# Patient Record
Sex: Male | Born: 1950
Health system: Southern US, Community
[De-identification: ages and names within clinical notes are randomized; demographics above are authoritative.]

## PROBLEM LIST (undated history)

## (undated) DIAGNOSIS — E7800 Pure hypercholesterolemia, unspecified: Secondary | ICD-10-CM

## (undated) DIAGNOSIS — I10 Essential (primary) hypertension: Secondary | ICD-10-CM

## (undated) HISTORY — PX: HERNIA REPAIR: SHX51

## (undated) HISTORY — PX: OTHER SURGICAL HISTORY: SHX169

---

## 2007-04-25 ENCOUNTER — Inpatient Hospital Stay (HOSPITAL_COMMUNITY): Admission: RE | Admit: 2007-04-25 | Discharge: 2007-04-30 | Payer: Self-pay | Admitting: Orthopedic Surgery

## 2008-06-18 ENCOUNTER — Inpatient Hospital Stay (HOSPITAL_COMMUNITY): Admission: RE | Admit: 2008-06-18 | Discharge: 2008-06-20 | Payer: Self-pay | Admitting: Orthopedic Surgery

## 2009-01-10 ENCOUNTER — Emergency Department (HOSPITAL_COMMUNITY): Admission: EM | Admit: 2009-01-10 | Discharge: 2009-01-10 | Payer: Self-pay | Admitting: Emergency Medicine

## 2010-04-07 ENCOUNTER — Encounter: Payer: Self-pay | Admitting: Endocrinology

## 2010-04-16 ENCOUNTER — Encounter: Payer: Self-pay | Admitting: Endocrinology

## 2010-04-21 ENCOUNTER — Ambulatory Visit: Payer: Self-pay | Admitting: Endocrinology

## 2010-04-21 DIAGNOSIS — I4891 Unspecified atrial fibrillation: Secondary | ICD-10-CM | POA: Insufficient documentation

## 2010-04-21 DIAGNOSIS — E7800 Pure hypercholesterolemia, unspecified: Secondary | ICD-10-CM

## 2010-04-21 DIAGNOSIS — N529 Male erectile dysfunction, unspecified: Secondary | ICD-10-CM | POA: Insufficient documentation

## 2010-04-21 DIAGNOSIS — E0590 Thyrotoxicosis, unspecified without thyrotoxic crisis or storm: Secondary | ICD-10-CM | POA: Insufficient documentation

## 2010-04-21 DIAGNOSIS — I10 Essential (primary) hypertension: Secondary | ICD-10-CM | POA: Insufficient documentation

## 2010-04-21 DIAGNOSIS — M1990 Unspecified osteoarthritis, unspecified site: Secondary | ICD-10-CM | POA: Insufficient documentation

## 2010-04-21 DIAGNOSIS — E042 Nontoxic multinodular goiter: Secondary | ICD-10-CM | POA: Insufficient documentation

## 2010-05-01 ENCOUNTER — Encounter: Payer: Self-pay | Admitting: Endocrinology

## 2010-05-04 ENCOUNTER — Encounter: Payer: Self-pay | Admitting: Endocrinology

## 2010-05-07 ENCOUNTER — Telehealth (INDEPENDENT_AMBULATORY_CARE_PROVIDER_SITE_OTHER): Payer: Self-pay | Admitting: *Deleted

## 2010-05-23 ENCOUNTER — Ambulatory Visit (HOSPITAL_COMMUNITY)
Admission: RE | Admit: 2010-05-23 | Discharge: 2010-05-23 | Payer: Self-pay | Source: Home / Self Care | Admitting: Endocrinology

## 2010-07-31 NOTE — Assessment & Plan Note (Signed)
Summary: New endo/redding/bcbs/hyperactive thyroid/cd   Vital Signs:  Patient profile:   60 year old male Height:      76 inches (193.04 cm) Weight:      324 pounds (147.27 kg) BMI:     39.58 O2 Sat:      98 % on Room air Temp:     97.9 degrees F (36.61 degrees C) oral Pulse rate:   71 / minute BP sitting:   102 / 70  (left arm) Cuff size:   large  Vitals Entered By: Brenton Grills MA (April 21, 2010 8:22 AM)  O2 Flow:  Room air CC: New Endo/Overactive thyroid/Dr. Jeanie Sewer Is Patient Diabetic? No   Primary Provider:  Jonny Ruiz   CC:  New Endo/Overactive thyroid/Dr. Jeanie Sewer.  History of Present Illness: pt was noted to have goiter in 2008.  then 2 weeks ago, he was noted to have atrial fibrillation.  pt declined coumadin. symptomatically, pt states few mos of slight left hand numbness, and assoc weight gain.  Current Medications (verified): 1)  Lisinopril 40 Mg Tabs (Lisinopril) .Marland Kitchen.. 1 By Mouth Once Daily 2)  Hydrochlorothiazide 25 Mg Tabs (Hydrochlorothiazide) .Marland Kitchen.. 1 Tablet By Mouth Once Daily 3)  Pravastatin Sodium 40 Mg Tabs (Pravastatin Sodium) .Marland Kitchen.. 1 Tablet By Mouth Once Daily 4)  Metoprolol Tartrate 25 Mg Tabs (Metoprolol Tartrate) .... 2 Tablets By Mouth Once Daily 5)  Vitamin E 400 Unit Caps (Vitamin E) .Marland Kitchen.. 1 By Mouth Once Daily 6)  Multivitamins  Tabs (Multiple Vitamin) .Marland Kitchen.. 1 By Mouth Once Daily 7)  Fish Oil 1200 Mg Caps (Omega-3 Fatty Acids) .Marland Kitchen.. 1-2 By Mouth Once Daily 8)  Aspirin 325 Mg Tabs (Aspirin) .Marland Kitchen.. 1 By Mouth Once Daily  Allergies (verified): No Known Drug Allergies  Family History: Reviewed history and no changes required. no goiter or other thyroid probs  Social History: Reviewed history and no changes required. 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    Review of Systems       denies headache, hoarseness, double vision, sob, n/v, diarrhea, myalgias, numbness, tremor, anxiety, bruising, and rhinorrhea.  he reports fatigue, palpitations,  erectile dysfunction, and insomnia.  he attributes polyuria to diuretic rx.  there is no change on excessive diaphoresis.  Physical Exam  General:  morbidly obese.  no distress  Head:  head: no deformity eyes: no periorbital swelling, no proptosis external nose and ears are normal mouth: no lesion seen Neck:  multinodular goiter, right > left Lungs:  Clear to auscultation bilaterally. Normal respiratory effort.  Heart:  Regular rate and rhythm without murmurs or gallops noted. Normal S1,S2.   Abdomen:  abdomen is soft, nontender.  no hepatosplenomegaly.   not distended.  no hernia  Msk:  muscle bulk and strength are grossly normal.  no obvious joint swelling.  gait is normal and steady  Pulses:  dorsalis pedis intact bilat.  no carotid bruit Extremities:  no deformity 1+ right pedal edema and 1+ left pedal edema.   Neurologic:  cn 2-12 grossly intact.   readily moves all 4's.   sensation is intact to touch on the feet  Skin:  normal texture and temp.  no rash.  not diaphoretic  Cervical Nodes:  No significant adenopathy.  Psych:  Alert and cooperative; normal mood and affect; normal attention span and concentration.   Additional Exam:  outside test results are reviewed:  i reviewed ultrasound tsh=0.09   Impression & Recommendations:  Problem # 1:  GOITER, MULTINODULAR (ICD-241.1) usually hereditary. he may  need bx prior to the i-131 rx, if this need is indicated by the nuc med scan.  Problem # 2:  HYPERTHYROIDISM (ICD-242.90) prob due to #1.   we discussed the causes, risks, and treatment options .  Problem # 3:  ATRIAL FIBRILLATION (ICD-427.31) prob due to #2. is well-controlled enough that we seem to have enough time to do i-131 rx, but he'll need to start thionamide rx soon thereafter.  Medications Added to Medication List This Visit: 1)  Lisinopril 40 Mg Tabs (Lisinopril) .Marland Kitchen.. 1 by mouth once daily 2)  Hydrochlorothiazide 25 Mg Tabs (Hydrochlorothiazide) .Marland Kitchen.. 1  tablet by mouth once daily 3)  Pravastatin Sodium 40 Mg Tabs (Pravastatin sodium) .Marland Kitchen.. 1 tablet by mouth once daily 4)  Metoprolol Tartrate 25 Mg Tabs (Metoprolol tartrate) .... 2 tablets by mouth once daily 5)  Vitamin E 400 Unit Caps (Vitamin e) .Marland Kitchen.. 1 by mouth once daily 6)  Multivitamins Tabs (Multiple vitamin) .Marland Kitchen.. 1 by mouth once daily 7)  Fish Oil 1200 Mg Caps (Omega-3 fatty acids) .Marland Kitchen.. 1-2 by mouth once daily 8)  Aspirin 325 Mg Tabs (Aspirin) .Marland Kitchen.. 1 by mouth once daily 9)  Methimazole 10 Mg Tabs (Methimazole) .Marland Kitchen.. 1 tab two times a day.  start 3 days after the radioactive iodine pill.  Other Orders: Radiology Referral (Radiology) Consultation Level IV [phone])  Patient Instructions: 1)  check thyroid "scan" (a special but easy and painless thpe of thyroid x ray).  you will be called with a day and time for an appointment 2)  based on the results of that, i would probably advise a 1-time radioactive iodine pill, to shrink or destroy the lumps, and normalize the blood tests. 3)  3 days after the radioactive iodine pill, start methimazole 10 mg two times a day. 4)  return here 2-3 weeks after the radioactive iodine pill. 5)  if ever you have fever while taking this medication, stop it and call us, because of the risk of a rare side-effect. 6)  cc [facility].   Prescriptions: METHIMAZOLE 10 MG TABS (METHIMAZOLE) 1 tab two times a day.  start 3 days after the radioactive iodine pill.  #60 x 1   Entered and Authorized by:   Minus Breeding MD   Signed by:   Minus Breeding MD on 04/21/2010   Method used:   Electronically to        [facility]  [address] [phone] (retail)       [address]       [place], [place]  [address]       Ph: [phone] or [phone]       Fax: [phone]   RxID:   [id number]    Orders Added: 1)  Radiology Referral [Radiology] 2)  Consultation Level IV [18841]

## 2010-07-31 NOTE — Miscellaneous (Signed)
Summary: Orders Update  Clinical Lists Changes  Orders: Added new Referral order of Iodine - i-131 Treatment (Capsule) Nuc Med Therapeutic  (i131 Tx) - Signed

## 2010-07-31 NOTE — Progress Notes (Signed)
----   Converted from flag ---- ---- 05/06/2010 2:00 PM, Minus Breeding MD wrote: ok  ---- 05/06/2010 12:28 PM, Shelbie Proctor wrote: [facility] could not do this i-131 tx  because they do not have enough dosage for pt . want to know if the pt can have it done in [place] pls advise ------------------------------

## 2010-07-31 NOTE — Letter (Signed)
Summary: [facility]   Imported By: Lennie Odor 04/23/2010 14:03:01  _____________________________________________________________________  External Attachment:    Type:   Image     Comment:   External Document

## 2010-07-31 NOTE — Progress Notes (Signed)
----   Converted from flag ---- ---- 05/06/2010 12:28 PM, Shelbie Proctor wrote: [facility] could not do this i-131 tx  because they do not have enough dosage for pt . want to know if the pt can have it done in [place] pls advise ------------------------------

## 2010-10-13 ENCOUNTER — Other Ambulatory Visit: Payer: Self-pay | Admitting: Endocrinology

## 2010-11-11 NOTE — Op Note (Signed)
NAMELAKEITH, James Schaefer NO.:  [id number]   MEDICAL RECORD NO.:  [id number]          PATIENT TYPE:  INP   LOCATION:  2550                         FACILITY:  [facility]   PHYSICIAN:  Mila Homer. Sherlean Foot, M.D. DATE OF BIRTH:  May 25, 1951   DATE OF PROCEDURE:  04/25/2007  DATE OF DISCHARGE:                               OPERATIVE REPORT   SURGEON:  Mila Homer. Sherlean Foot, M.D.   ASSISTANT:  Arnoldo Morale, PA   ANESTHESIA:  General.   PREOPERATIVE DIAGNOSIS:  Left knee osteoarthritis.   POSTOPERATIVE DIAGNOSIS:  Left knee osteoarthritis.   PROCEDURE:  Left total knee arthroplasty.   INDICATIONS FOR PROCEDURE:  The patient 60 year old white male, failure  conservative measures for osteoarthritis left knee.  Informed consent  was obtained.   DESCRIPTION OF PROCEDURE:  The patient was laid supine position,  administered general anesthesia.  Left leg was prepped and draped in  usual sterile fashion.  Extremity was exsanguinated with the Esmarch and  tourniquet inflated to 375 mmHg.  I then made a midline incision with  #10 blade.  I used a new 10 blade to make a medial parapatellar  arthrotomy performed synovectomy.  There was lots synovitis in this  knee.  I then elevated the deep MCL off the medial crest of the tibia  around but not through the semimembranosus tendon.  I then everted the  patella.  It measured 25 mm thick.  I reamed down to 16, drilled two  holes and removed excess bone.  With the trial in place also measured 25  mm thick.  I then went into flexion, subluxing the patella laterally.  I  then used the extramedullary alignment system making a perpendicular cut  to the anatomic axis of the tibia.  I then removed the cut surface of  the bone with extramedullary guide.  I then made an intramedullary drill  hole in the femur and placed the distal femoral cutting blocks on 6  degree valgus cut made the distal femoral cut with sagittal saw.  I then  marked out the  epicondylar axis posterior condylar angle measured 3  degrees.  I then pinned the G cutting block into place, made the  anterior-posterior chamfer cuts with a sagittal saw.  I then placed a  lamina spreader in the knee, removed the medial lateral menisci,  posterior condylar osteophytes, posterior condylar osteophytes and  medial lateral menisci, ACL and PCL.  I then finished the femur with a  size G finishing block, finished the tibia with a size 8 tibial tray  drill and keel.  I then copiously irrigated the knee after removing the  trials.  I then cemented in the components,  8 tibia, G femur, 14  insert, 35 patella.  Removed excess cement and allowed the cement harden  in extension.  I placed a Hemovac coming out superolaterally deep to the  arthrotomy, pain catheter coming out supermedial and superficial to the  arthrotomy.  Closed the arthrotomy with figure-of-eight #1 Vicryl  sutures and deep soft tissues  buried 0 Vicryl suture, subcuticular 2-0 Vicryl stitch.  Skin staples.  Dressings Xeroform dressing sponges, ABDs and Webril and a TED stocking.  Complications none.  Drains one Hemovac, one pain catheter.  EBL 300 mL.  Tourniquet time 54 minutes.           ______________________________  Mila Homer Sherlean Foot, M.D.     SDL/MEDQ  D:  04/25/2007  T:  04/25/2007  Job:  [id number]

## 2010-11-11 NOTE — Op Note (Signed)
James Schaefer, James Schaefer NO.:  [id number]   MEDICAL RECORD NO.:  [id number]          PATIENT TYPE:  INP   LOCATION:  2550                         FACILITY:  [facility]   PHYSICIAN:  Mila Homer. Sherlean Foot, M.D. DATE OF BIRTH:  Aug 06, 1950   DATE OF PROCEDURE:  06/18/2008  DATE OF DISCHARGE:                               OPERATIVE REPORT   SURGEON:  Mila Homer. Sherlean Foot, M.D.   ASSISTANT:  1. Altamese Cabal, PA-C  2. Skip Mayer, PA-C   ANESTHESIA:  General.   PREOPERATIVE DIAGNOSIS:  Right knee osteoarthritis.   POSTOPERATIVE DIAGNOSIS:  Right knee osteoarthritis.   PROCEDURE:  Right total knee arthroplasty.   INDICATIONS FOR PROCEDURE:  The patient is a 60 year old white male  status post with failure to conservative measures for osteoarthritis of  the right knee. Informed consent obtained.   DESCRIPTION OF PROCEDURE:  The patient was laid supine, administered  general anesthesia, and Foley catheter placement.  Then right leg was  prepped and draped in the usual sterile fashion.  The leg was  exsanguinated with Esmarch and tourniquet inflated to 375 mmHg and set  for an hour.  Midline incision was made with a #7 blade,  #10 blade.  New blade was used to make a median parapatellar arthrotomy and  performed a synovectomy.  It was a varus knee.  I elevated the MCL off  the medial crest of the tibia around to the semimembranosus tendon.  I  everted the patella, it measured 24-mm thick.  I reamed down to 15 mm.  Drilled through lug holes through a 38-mm template and with prosthetic  trial in place, I recreated 24-mm thickness.  We removed the prosthetic  trial.  Went into flexion with subluxing the patella laterally.  I used  the extramedullary alignment system on the femur to make a perpendicular  cut to the anatomic axis of tibia.  I then used the intramedullary guide  on the femur to make a 6-degree valgus cut to the anatomic axis of  femur.  I marked out the epicondylar  axis.  The posterior condylar angle  measured 3 degrees.  I then put in the size G cutting block into place  and 3 degrees of external rotation and made the anterior, posterior, and  chamfer cuts.  At this point, I placed the lamina spreader in the knee,  removed the ACL, PCL, medial lateral menisci, and posterior condylar  osteophytes.  Then placed a spacer block in the knee.  I again  __________ with a 14-mm spacer block.  At this point, finished the femur  to size G finishing block, finished the tibia to a size 8 tibial tray  drilling keel, and then trialed with a size G femur with 14 insert, 8  tibia, 38 patella and had good flexion/extension gap balance including  the patellar track.  I removed the trial components and copiously  irrigated.  I cemented in the components and removed all excess cement.  Allowed the cement to harden with the leg in extension.  I let the  tourniquet, obtained hemostasis and irrigated copiously again with  Pulsavac system.  Then, left a Hemovac coming out superolaterally and  deep to the arthrotomy.  Pain catheter coming out supermedial and  superficial to the arthrotomy.  I closed the arthrotomy with figure-of-  eight #1 Vicryl sutures, deep soft tissue with buried 0 Vicryl sutures,  and then subcuticular 2-0 Vicryl stitch.  Then staples, dressing sponges  with Xeroform, ABDs, Webril and TED stockings.   TOURNIQUET TIME:  1 hour and 6 minutes.   COMPLICATIONS:  None.   DRAINS:  One Hemovac and one pain catheter.   ESTIMATED BLOOD LOSS:  300 mL.           ______________________________  Mila Homer. Sherlean Foot, M.D.     SDL/MEDQ  D:  06/18/2008  T:  06/19/2008  Job:  [id number]

## 2010-11-11 NOTE — Consult Note (Signed)
James Schaefer, James Schaefer NO.:  [id number]   MEDICAL RECORD NO.:  [id number]          PATIENT TYPE:  INP   LOCATION:  5031                         FACILITY:  [facility]   PHYSICIAN:  James Second, MD       DATE OF BIRTH:  05-06-1951   DATE OF CONSULTATION:  04/27/2007  DATE OF DISCHARGE:                                 CONSULTATION   REASON FOR CONSULTATION:  Low platelets postoperatively.   HISTORY OF PRESENT ILLNESS:  Mr. James Schaefer is a pleasant, 60 year old  white male with a history of left knee osteoarthritis, now status post  left total knee arthroplasty on April 25, 2007, whose preoperative CBC  revealed normal platelet count of 157,000 on April 19, 2007, with  normal white count and hemoglobin and hematocrit.  On day two  postoperatively, the platelets dropped to 147,000 and today, April 27, 2007, the platelets further decreased to a value between 80,0000 and  83,000.  There is no evidence of bleeding.  The patient has been  receiving low-molecular-weight heparin (Lovenox), as part of postop  prophylaxis.  Lovenox was stopped today.  The rapid heparin antibody  screen test was negative.  The patient has no apparent previous history  of thrombocytopenia or other hematological diseases.  Per his report, he  has no previous history of exposure to heparin.  Based on these  findings, we were asked to see Mr. James Schaefer, with recommendations  regarding his care.   PAST MEDICAL HISTORY:  1. Remote tobacco use, quitting in 1985.  2. Obesity, morbid to severe.  3. Right knee osteoarthritis.  4. Possible sleep apnea, with steady decline.   SURGERIES:  1. Status post left total knee arthroplasty on April 25, 2007, Dr.      Sherlean Schaefer.  2. Status post bilateral inguinal hernia repair in 1995.  3. Status post thyroid cyst excision in October of 2008.  4. Status post penile implant.   ALLERGIES:  NKDA.   CURRENT MEDICATIONS:  1. Colace.  2. HCTZ.  3. Altace.  4. Senna.  5. Zocor.  6. Tylenol.  7. Depakote.  8. Robaxin.  9. Reglan.  10.Percocet.   REVIEW OF SYSTEMS:  Remarkable for weight loss, which is self-inflicted,  in order to undergo surgery.  He denies any fever, chills, night sweats  or fatigue.  No vision changes, headaches, shortness of breath, dyspnea  or exertion, nonproductive cough.  No chest pain, no palpitations, no  nausea, vomiting, diarrhea, constipation, dysphagia or blood in the  stools.  No dysuria, gross hematuria, edema, musculoskeletal pain or  dysesthesias.  However, he noticed more bruising during the last month.   FAMILY HISTORY:  Mother alive with hypertension; father alive with CHF.  There is no family history with bleeding disorders or cancer.   SOCIAL HISTORY:  The patient is married, he has two children.  He works  on a dock.  No alcohol or tobacco history.  [place].   PHYSICAL EXAMINATION:  GENERAL:  This is a 60 year old white male in no  acute distress, alert and oriented x3.  VITAL SIGNS:  Blood  pressure 130/83, pulse 101, respirations 18,  temperature 98.7, T-max 100.7, weight 133 kilograms, height 74 inches.  HEENT:  Normocephalic, atraumatic.  PERRLA.  Oral mucosa without thrush  or lesions.  NECK:  Supple.  No cervical or supraclavicular masses.  LUNGS:  Clear to auscultation bilaterally.  No axillary masses.  CARDIOVASCULAR:  Regular rate and rhythm without murmurs, rubs or  gallops.  ABDOMEN:  Obese, nontender.  Bowel sounds x4.  No palpable spleen or  liver.  GU/RECTAL:  Deferred.  The patient has a Foley, with some blood visible.  EXTREMITIES:  With no clubbing or cyanosis, bilateral TED hose.  Bandage  on the left knee.  No petechial rash.  Mild bruising.   LABS:  Hemoglobin 10.8, hematocrit 31.3, white count 11.7, platelets 80,  neutrophils 9.6, monocytes 1.3, MCV 95.1.  PT 15.7, INR 1.2.  Sodium  134, potassium 3.9, BUN 11, creatinine 0.91, glucose 128.  Total  bilirubin  1.2, alkaline phosphatase 68, AST 21, ALT 22, total protein  6.7, albumin 4.0, calcium 8.6.  HIT screen negative.  Chest x-ray  negative.   ASSESSMENT/PLAN:  Dr. Welton Schaefer has seen and evaluated the patient and the  chart has been reviewed.  This is a 60 year old white male with a  history of left total knee arthroplasty on April 25, 2007.  He had a  postoperative course complicated now by thrombocytopenia in the setting  of exposure to low-molecular-weight heparin.  Rapid heparin antibody  screen was negative.  The patient is without evidence of bleeding or  clots.  Differential includes consumptive coagulopathy such as  disseminated intravascular coagulation, consumption of the platelets  secondary to recent surgery.  Review of the current medications does not  reveal any unusual drug exposure leading to decrease in the platelets.  The recommendations are as follows:   1. Check a disseminated intravascular coagulation panel.  2. Will review the smear for hemolysis.  3. C-Met, LDH.  4. Recheck the platelets in the morning.  5. Hold the Lovenox for now.  6. Avoid NSAIDs.  7. Will follow with you.  Further recommendations pending on the      results of the labs ordered.      James Schaefer, P.A.      James Second, MD  Electronically Signed    SW/MEDQ  D:  04/28/2007  T:  04/29/2007  Job:  [id number]

## 2010-11-14 NOTE — Discharge Summary (Signed)
[name], TEEPLE NO.:  [id number]   MEDICAL RECORD NO.:  [id number]          PATIENT TYPE:  INP   LOCATION:  5031                         FACILITY:  [facility]   PHYSICIAN:  Mila Homer. Sherlean Foot, M.D. DATE OF BIRTH:  [birth year]   DATE OF ADMISSION:  04/25/2007  DATE OF DISCHARGE:  04/30/2007                               DISCHARGE SUMMARY   ADMITTING DIAGNOSES:  1. Osteoarthritis, left knee.  2. Osteoarthritis, right knee.  3. Hypertension.   DISCHARGE DIAGNOSES:  1. Status post left total knee arthroplasty.  2. Pyrexia, resolved.  3. Leukocytosis, resolved.  4. Thrombocytopenia felt to be due to consumptive coagulopathy,      possibly due to recent surgery.   HISTORY OF PRESENT ILLNESS:  James Schaefer is a 61 year old male with a  2-year history of left greater than right knee pain.  Pain now is  constant, moderately severe, throbbing and achy pain with occasional  sharp pain.  He has swelling and weakness in the left leg.  Pain in the  left knee does awaken him at night.  Activity worsens pain.  The patient  failed conservative treatment, which include NSAID and hydrocortisone  injections, also weight loss.  OA of the left knee is severe on  radiograph.  The patient was admitted to undergo left total knee  arthroplasty.   SURGICAL PROCEDURE:  The patient was taken to the operating room on  April 25, 2007 by Dr. Georgena Spurling, assisted by Arnoldo Morale, P.A.-C.  The patient underwent general anesthesia and underwent left total knee  arthroplasty.  The following components were used:  Left knee, size G  femoral component, a fluted tibial component, size 8, an all-poly  patella, size 35, with 9-mm thickness and a 14-mm bearing.  The patient  tolerated the procedure well and returned to Recovery in good and stable  condition.   CONSULTS:  The following consults were obtained while the patient was  hospitalized:  1. PT.  2. OT.  3. Case Management.  4.  Hematology/Oncology.   HOSPITAL COURSE:  On postop day #1, the patient was afebrile, vital  signs stable, H&H 12 and 35.1, platelets 147,000.   On postop day #2, the patient wanted to go home with no chest pain, no  shortness of breath, nausea or vomiting, no complaint of nose bleeds.  T-  max was 100.7; otherwise, vital signs stable.  H&H 10.8 and 31, white  blood count was 12,200 and platelets were 83,000.  CBC was repeated and  platelets remained 80,000.   Consult was made to Hematology/Oncology due to the patient's  thrombocytopenia.  Rapid heparin antibiotic screen was negative.  Differential included consumptive coagulopathy, such as DIC, consumption  of platelets secondary to surgery.  Lovenox was placed on hold.  CMET  and LDH were checked.  Hematology/Oncology to review smears for  hemolysis.  DIC panel was performed.   Postoperative day #3, patient with no complaints, no nose bleeds, some  dizziness with ambulation, afebrile, vital signs stable.  H&H 9.4 and  27.1 and platelets 60,000.  PT was 15.4, INR 1.2, PTT was  37.  HIT  screen again was negative. White count was trending down; it was 11,100  and patient again afebrile.  On the review of peripheral smears, no  hemolysis was noted and no hemolysis noted on labs.  The patient's  thrombocytopenia was felt to be secondary to consumption postop.  Lovenox was continued to be held.   Postop day #4, the patient complained of hot flashes with 1 of his  medications, either Percocet or Robaxin.  He had coffee around the time  of temperature of 102.1 on late April 28, 2007.  Temperature at the  time of rounds was 98.3; otherwise, vital signs were stable.  On labs,  H&H were 9.3 and 27, white count was 8400, platelets at 94,000.  PT 14.7  and INR was 1.1.  Patient without any evidence of bleeding.  Lovenox was  started prophylactically by Hematology/Oncology on October 32, 2008.  It  was felt that if the patient's platelets  remained stable or increased,  he could be discharged to home on April 30, 2007.  On April 30, 2007, the patient was doing well, no chest pain, no shortness of breath,  H&H 8.8 and 26.1 and platelets were increased to 98,000.  The patient  did note some fatigue and weakness, but did not want to have a blood  transfusion.  The patient was placed on iron 325 mg one p.o. b.i.d. for  3 weeks.  The patient had repeat CBC on Monday by [facility].  The  patient was discharged to home that day in good and stable condition.   LABORATORY DATA:  On routine labs on admission, CBC revealed white count  of 8400, hemoglobin was 15.3, hematocrit 44.7 and platelets 157,000.  Coagulations on admission:  PT was 12.9, INR was 1, PTT was 31.  Routine  chemistries on admission:  Sodium 135, potassium 3.7, chloride 101,  bicarb 28.  Glucose was slightly elevated at 104.  BUN was 15,  creatinine 1.  Hepatic enzymes on admission:  TP was 6.7, albumin was 4,  AST was 21, ALT 22, ALP was 68 and total bilirubin was 1.2.  Urinalysis on admission:  Trace hemoglobin, otherwise negative.   Rapid HIT screen negative.  DIC was read as inconclusive by  Hematology/Oncology.   EKG on admission dated April 19, 2007 showed normal sinus rhythm with  a ventricular rate of 88 beats per minute, P-R interval 171 seconds,  P/R/T axis 48, -12 and 26.   DISCHARGE INSTRUCTIONS:   MEDICATIONS:  1. Percocet 5/325 mg one to two every 4-6 hour for pain.  2. Skelaxin 1 tab 3 times daily as needed for spasm.  3. Laxative and stool softener as needed.  4. Arixtra 2.5 mg one subcu injection for 3 weeks.  5. Iron 325 mg one p.o. b.i.d. with meals.  6. Hold aspirin until finishing Arixtra.  7. Patient to resume ramipril, Simvastatin, hydrochlorothiazide and      his multivitamin; hold aspirin and vitamin E.   ACTIVITY:  The patient is weightbearing as tolerated with a walker.  CMP  0-90 degrees 6-8 hours a day, may increase  by 10 degrees daily.   WOUND CARE:  Keep wound clean and dry, change dressing daily, may shower  after 2 days if no drainage.   FOLLOWUP:  The patient needs to follow up with Dr. Sherlean Foot on Tuesday,  May 10, 2007; patient to call [phone] for appointment.   DIET:  No restrictions.   SPECIAL INSTRUCTIONS:  Repeat CBC on Monday.  Genevieve Norlander results are to be  called to the office at [phone].   CONDITION ON DISCHARGE:  The patient was discharged to home in good and  stable condition.      Richardean Canal, P.A.    ______________________________  Mila Homer. Sherlean Foot, M.D.    GC/MEDQ  D:  07/24/2007  T:  07/25/2007  Job:  [id number]   cc:   Durenda Hurt, M.D.

## 2010-11-14 NOTE — Discharge Summary (Signed)
NAMEJOASH, TONY NO.:  [id number]   MEDICAL RECORD NO.:  [id number]          PATIENT TYPE:  INP   LOCATION:  5025                         FACILITY:  [facility]   PHYSICIAN:  Mila Homer. Sherlean Foot, M.D. DATE OF BIRTH:  09/01/1950   DATE OF ADMISSION:  06/18/2008  DATE OF DISCHARGE:  06/20/2008                               DISCHARGE SUMMARY   ADMISSION DIAGNOSES:  1. Right knee osteoarthritis.  2. Hypertension.  3. Past medical history significant for left total knee arthroplasty.   DISCHARGE DIAGNOSES:  1. Osteoarthritis of the right knee.  2. Status post right total knee arthroplasty.  3. Acute blood loss anemia status post surgery.   PROCEDURE:  Right total knee arthroplasty.   HISTORY:  The patient is a 60 year old male with several year history of  severe throbbing knee pain, swelling, weakness.  Pain is not interfering  with activities of daily living.  Conservative treatments failed.  Risks  and benefits of surgery were discussed and the patient would like to  proceed with surgery.   PAST MEDICAL HISTORY:  Left TKA.   ALLERGIES:  The patient has no known drug allergies.   ADMISSION MEDICATIONS:  1. Ramipril 10 mg twice a day.  2. Simvastatin 40 mg daily.  3. Hydrochlorothiazide 25 mg daily.  4. Aspirin 81 mg daily.  5. Vitamin E 400 International Units daily.  6. Multivitamin once a day.   HOSPITAL COURSE:  This is a 60 year old male admitted on June 18, 2008, after appropriate laboratory studies were obtained preoperatively  as well as Ancef on coming to the operating room.  He was taken to the  OR where he underwent a right TKA.  The patient tolerated the procedure  well and was taken to the PACU in good condition, placed on a Dilaudid  PCA pump, full-dose protocol.  Foley was placed intraoperatively.   On postop day 1, vital signs stable, the patient denied chest pain,  shortness of breath, or calf pain.  The patient was started on  Arixtra  2.5 mg inject 1 subcutaneously daily at 8:00 p.m.  On the night of  surgery, consults PT, OT, and case management were made.  Weightbearing  as tolerated, CPM 0-90 degrees for 6-8 hours per day, incentive  spirometry teaching.   On postop day 2, the patient continued to progress with physical  therapy.  Dressing was changed.  Marcaine pump, Hemovac were  discontinued.  Foley was discontinued.  PCA was discontinued and IV was  Hep-Lock.  The patient was continued on p.o. meds for pain.  The patient  was discharged home after Lovenox teaching.   LABORATORY STUDIES:  On admission; white blood cell count was 7.2, H and  H 15.4 and 45.2, platelets 189.  Sodium was 139, potassium was 4.1,  chloride was 103, CO2 was 30, glucose 103, BUN was 14, creatinine was  0.88.  Upon discharge; white blood cell count was 0.5, H and H was 11.3  and 32.9, platelets 107.  Sodium was 135, potassium was 3.7, chloride  was 97, CO2 was 30, glucose was 124, BUN was  10, creatinine was 0.8.   DISCHARGE INSTRUCTIONS:  There are no restrictions.  Follow the blue  instruction sheet for wound care.  Increase activity slowly.  May use a  walker or cane.  Weightbearing as tolerated.  No lifting or driving for  6 weeks, home health.  CPM 0-90 degrees for 6-8 hours per day x2 weeks.  TED hose bilateral x3 weeks.   DISCHARGE MEDICATIONS:  The patient was given prescription for:  1. Arixtra 2.5 inject 1 subcutaneously daily, last dose June 27, 2008, gave at 8:00 p.m.  2. Robaxin 500 mg 1-2 tabs every 6-8 hours as needed for spasm.  3. Percocet 5/325 one to two tablets every 4-6 hours as needed for      pain.   FOLLOWUP:  The patient will follow up with Dr. Sherlean Foot on July 03, 2008,  call for appointment [phone].   Discharged in improved condition.     ______________________________  Altamese Cabal, PA-C    ______________________________  Mila Homer. Sherlean Foot, M.D.    MJ/MEDQ  D:  08/17/2008  T:   08/18/2008  Job:  [id number]

## 2011-04-03 LAB — URINE CULTURE
Colony Count: NO GROWTH
Colony Count: NO GROWTH
Culture: NO GROWTH

## 2011-04-03 LAB — COMPREHENSIVE METABOLIC PANEL
Alkaline Phosphatase: 77 U/L (ref 39–117)
BUN: 14 mg/dL (ref 6–23)
CO2: 30 mEq/L (ref 19–32)
Chloride: 103 mEq/L (ref 96–112)
Creatinine, Ser: 0.88 mg/dL (ref 0.4–1.5)
GFR calc non Af Amer: >60 mL/min (ref >60)
Glucose, Bld: 103 mg/dL — ABNORMAL HIGH (ref 70–99)
Potassium: 4.1 mEq/L (ref 3.5–5.1)
Total Bilirubin: 1 mg/dL (ref 0.3–1.2)

## 2011-04-03 LAB — CBC
HCT: 45.2 % (ref 39.0–52.0)
Hemoglobin: 11.3 g/dL — ABNORMAL LOW (ref 13.0–17.0)
Hemoglobin: 15.4 g/dL (ref 13.0–17.0)
MCHC: 34.3 g/dL (ref 30.0–36.0)
MCV: 93.3 fL (ref 78.0–100.0)
MCV: 94.4 fL (ref 78.0–100.0)
Platelets: 147 10*3/uL — ABNORMAL LOW (ref 150–400)
Platelets: 189 10*3/uL (ref 150–400)
RBC: 4.79 MIL/uL (ref 4.22–5.81)
RDW: 13 % (ref 11.5–15.5)
RDW: 13.1 % (ref 11.5–15.5)
WBC: 10.9 10*3/uL — ABNORMAL HIGH (ref 4.0–10.5)
WBC: 7.2 10*3/uL (ref 4.0–10.5)

## 2011-04-03 LAB — DIFFERENTIAL
Basophils Absolute: 0 10*3/uL (ref 0.0–0.1)
Basophils Relative: 0 % (ref 0–1)
Lymphocytes Relative: 14 % (ref 12–46)
Neutro Abs: 5 10*3/uL (ref 1.7–7.7)
Neutrophils Relative %: 70 % (ref 43–77)

## 2011-04-03 LAB — CROSSMATCH

## 2011-04-03 LAB — BASIC METABOLIC PANEL
BUN: 13 mg/dL (ref 6–23)
CO2: 30 mEq/L (ref 19–32)
Calcium: 8.2 mg/dL — ABNORMAL LOW (ref 8.4–10.5)
Calcium: 8.3 mg/dL — ABNORMAL LOW (ref 8.4–10.5)
Chloride: 97 mEq/L (ref 96–112)
Creatinine, Ser: 0.8 mg/dL (ref 0.4–1.5)
GFR calc non Af Amer: >60 mL/min (ref >60)
Glucose, Bld: 124 mg/dL — ABNORMAL HIGH (ref 70–99)
Glucose, Bld: 125 mg/dL — ABNORMAL HIGH (ref 70–99)
Potassium: 3.9 mEq/L (ref 3.5–5.1)

## 2011-04-03 LAB — URINALYSIS, ROUTINE W REFLEX MICROSCOPIC
Bilirubin Urine: NEGATIVE
Bilirubin Urine: NEGATIVE
Glucose, UA: NEGATIVE mg/dL
Ketones, ur: NEGATIVE mg/dL
Ketones, ur: NEGATIVE mg/dL
Leukocytes, UA: NEGATIVE
Nitrite: NEGATIVE
Protein, ur: 30 mg/dL — AB
Protein, ur: NEGATIVE mg/dL
Specific Gravity, Urine: 1.031 — ABNORMAL HIGH (ref 1.005–1.030)
pH: 6 (ref 5.0–8.0)
pH: 6.5 (ref 5.0–8.0)

## 2011-04-03 LAB — APTT: aPTT: 29 seconds (ref 24–37)

## 2011-04-03 LAB — URINE MICROSCOPIC-ADD ON

## 2011-04-03 LAB — PROTIME-INR: Prothrombin Time: 12.7 seconds (ref 11.6–15.2)

## 2011-04-07 LAB — CBC
MCHC: 33.9
Platelets: 98 — ABNORMAL LOW
RBC: 2.77 — ABNORMAL LOW

## 2011-04-08 LAB — URINALYSIS, ROUTINE W REFLEX MICROSCOPIC
Bilirubin Urine: NEGATIVE
Glucose, UA: NEGATIVE
Ketones, ur: NEGATIVE
Leukocytes, UA: NEGATIVE
Specific Gravity, Urine: 1.023
pH: 7

## 2011-04-08 LAB — DIFFERENTIAL
Basophils Absolute: 0.1
Basophils Relative: 0
Basophils Relative: 1
Eosinophils Absolute: 0
Eosinophils Relative: 0
Eosinophils Relative: 5
Lymphs Abs: 0.6 — ABNORMAL LOW
Monocytes Absolute: 0.7
Monocytes Relative: 11
Monocytes Relative: 9

## 2011-04-08 LAB — CROSSMATCH
ABO/RH(D): O POS
Antibody Screen: NEGATIVE

## 2011-04-08 LAB — CBC
HCT: 27 — ABNORMAL LOW
HCT: 27.1 — ABNORMAL LOW
HCT: 31 — ABNORMAL LOW
HCT: 31.3 — ABNORMAL LOW
HCT: 35.1 — ABNORMAL LOW
Hemoglobin: 10.8 — ABNORMAL LOW
Hemoglobin: 12 — ABNORMAL LOW
Hemoglobin: 9.3 — ABNORMAL LOW
Hemoglobin: 9.4 — ABNORMAL LOW
MCHC: 34.3
MCV: 93.3
MCV: 94.5
MCV: 95.1
MCV: 95.1
Platelets: 60 — ABNORMAL LOW
Platelets: 80 — ABNORMAL LOW
Platelets: 83 — ABNORMAL LOW
RBC: 2.86 — ABNORMAL LOW
RBC: 2.89 — ABNORMAL LOW
RBC: 3.29 — ABNORMAL LOW
RBC: 3.67 — ABNORMAL LOW
RBC: 4.7
RDW: 12.9
RDW: 13.1
WBC: 10.5
WBC: 11.1 — ABNORMAL HIGH
WBC: 11.7 — ABNORMAL HIGH
WBC: 8.4
WBC: 8.4

## 2011-04-08 LAB — BASIC METABOLIC PANEL
BUN: 11
CO2: 28
Calcium: 8.5
Chloride: 100
GFR calc Af Amer: >60
GFR calc non Af Amer: >60
GFR calc non Af Amer: >60
Glucose, Bld: 128 — ABNORMAL HIGH
Glucose, Bld: 136 — ABNORMAL HIGH
Potassium: 3.9
Potassium: 4.1
Sodium: 134 — ABNORMAL LOW
Sodium: 136

## 2011-04-08 LAB — COMPREHENSIVE METABOLIC PANEL
AST: 21
Albumin: 4
Alkaline Phosphatase: 45
Alkaline Phosphatase: 68
BUN: 18
Chloride: 101
Chloride: 96
Creatinine, Ser: 0.94
GFR calc Af Amer: >60
GFR calc non Af Amer: >60
Glucose, Bld: 153 — ABNORMAL HIGH
Potassium: 3.7
Potassium: 3.8
Sodium: 135
Total Bilirubin: 1.2
Total Bilirubin: 1.3 — ABNORMAL HIGH
Total Protein: 6.7

## 2011-04-08 LAB — URINE MICROSCOPIC-ADD ON

## 2011-04-08 LAB — URINE CULTURE: Colony Count: NO GROWTH

## 2011-04-08 LAB — ABO/RH: ABO/RH(D): O POS

## 2011-04-08 LAB — LACTATE DEHYDROGENASE: LDH: 127

## 2011-04-08 LAB — SAVE SMEAR

## 2011-04-08 LAB — PROTIME-INR: Prothrombin Time: 14.7

## 2011-04-08 LAB — DIC (DISSEMINATED INTRAVASCULAR COAGULATION)PANEL: Smear Review: NONE SEEN

## 2013-09-03 ENCOUNTER — Emergency Department (HOSPITAL_COMMUNITY): Payer: Worker's Compensation

## 2013-09-03 ENCOUNTER — Inpatient Hospital Stay (HOSPITAL_COMMUNITY)
Admission: EM | Admit: 2013-09-03 | Discharge: 2013-09-08 | DRG: 481 | Disposition: A | Discharge status: Skilled Nursing Facility | Payer: Worker's Compensation | Attending: Internal Medicine | Admitting: Internal Medicine

## 2013-09-03 ENCOUNTER — Encounter (HOSPITAL_COMMUNITY): Payer: Self-pay | Admitting: Emergency Medicine

## 2013-09-03 ENCOUNTER — Inpatient Hospital Stay (HOSPITAL_COMMUNITY): Payer: Worker's Compensation

## 2013-09-03 DIAGNOSIS — E042 Nontoxic multinodular goiter: Secondary | ICD-10-CM

## 2013-09-03 DIAGNOSIS — E785 Hyperlipidemia, unspecified: Secondary | ICD-10-CM | POA: Diagnosis present

## 2013-09-03 DIAGNOSIS — K560 Paralytic ileus: Secondary | ICD-10-CM | POA: Diagnosis not present

## 2013-09-03 DIAGNOSIS — K929 Disease of digestive system, unspecified: Secondary | ICD-10-CM | POA: Diagnosis not present

## 2013-09-03 DIAGNOSIS — Y831 Surgical operation with implant of artificial internal device as the cause of abnormal reaction of the patient, or of later complication, without mention of misadventure at the time of the procedure: Secondary | ICD-10-CM | POA: Diagnosis not present

## 2013-09-03 DIAGNOSIS — Y990 Civilian activity done for income or pay: Secondary | ICD-10-CM

## 2013-09-03 DIAGNOSIS — Z800 Family history of malignant neoplasm of digestive organs: Secondary | ICD-10-CM

## 2013-09-03 DIAGNOSIS — N529 Male erectile dysfunction, unspecified: Secondary | ICD-10-CM

## 2013-09-03 DIAGNOSIS — E7800 Pure hypercholesterolemia, unspecified: Secondary | ICD-10-CM | POA: Diagnosis present

## 2013-09-03 DIAGNOSIS — I4891 Unspecified atrial fibrillation: Secondary | ICD-10-CM | POA: Diagnosis present

## 2013-09-03 DIAGNOSIS — Z8249 Family history of ischemic heart disease and other diseases of the circulatory system: Secondary | ICD-10-CM

## 2013-09-03 DIAGNOSIS — E0590 Thyrotoxicosis, unspecified without thyrotoxic crisis or storm: Secondary | ICD-10-CM

## 2013-09-03 DIAGNOSIS — Z7901 Long term (current) use of anticoagulants: Secondary | ICD-10-CM

## 2013-09-03 DIAGNOSIS — K9189 Other postprocedural complications and disorders of digestive system: Secondary | ICD-10-CM

## 2013-09-03 DIAGNOSIS — Z87891 Personal history of nicotine dependence: Secondary | ICD-10-CM

## 2013-09-03 DIAGNOSIS — M1990 Unspecified osteoarthritis, unspecified site: Secondary | ICD-10-CM

## 2013-09-03 DIAGNOSIS — Z96659 Presence of unspecified artificial knee joint: Secondary | ICD-10-CM

## 2013-09-03 DIAGNOSIS — K567 Ileus, unspecified: Secondary | ICD-10-CM | POA: Diagnosis not present

## 2013-09-03 DIAGNOSIS — Z841 Family history of disorders of kidney and ureter: Secondary | ICD-10-CM

## 2013-09-03 DIAGNOSIS — Z6837 Body mass index (BMI) 37.0-37.9, adult: Secondary | ICD-10-CM

## 2013-09-03 DIAGNOSIS — Y9210 Unspecified residential institution as the place of occurrence of the external cause: Secondary | ICD-10-CM | POA: Diagnosis not present

## 2013-09-03 DIAGNOSIS — S72009A Fracture of unspecified part of neck of unspecified femur, initial encounter for closed fracture: Secondary | ICD-10-CM | POA: Diagnosis present

## 2013-09-03 DIAGNOSIS — Z79899 Other long term (current) drug therapy: Secondary | ICD-10-CM

## 2013-09-03 DIAGNOSIS — E669 Obesity, unspecified: Secondary | ICD-10-CM | POA: Diagnosis present

## 2013-09-03 DIAGNOSIS — D696 Thrombocytopenia, unspecified: Secondary | ICD-10-CM | POA: Diagnosis present

## 2013-09-03 DIAGNOSIS — I10 Essential (primary) hypertension: Secondary | ICD-10-CM | POA: Diagnosis present

## 2013-09-03 DIAGNOSIS — S72143A Displaced intertrochanteric fracture of unspecified femur, initial encounter for closed fracture: Principal | ICD-10-CM | POA: Diagnosis present

## 2013-09-03 DIAGNOSIS — S72141A Displaced intertrochanteric fracture of right femur, initial encounter for closed fracture: Secondary | ICD-10-CM

## 2013-09-03 DIAGNOSIS — Y9269 Other specified industrial and construction area as the place of occurrence of the external cause: Secondary | ICD-10-CM

## 2013-09-03 DIAGNOSIS — W010XXA Fall on same level from slipping, tripping and stumbling without subsequent striking against object, initial encounter: Secondary | ICD-10-CM | POA: Diagnosis present

## 2013-09-03 HISTORY — DX: Essential (primary) hypertension: I10

## 2013-09-03 HISTORY — DX: Pure hypercholesterolemia, unspecified: E78.00

## 2013-09-03 LAB — CBC WITH DIFFERENTIAL/PLATELET
Basophils Absolute: 0 10*3/uL (ref 0.0–0.1)
Basophils Relative: 0 % (ref 0–1)
Eosinophils Absolute: 0.2 10*3/uL (ref 0.0–0.7)
Eosinophils Relative: 3 % (ref 0–5)
HCT: 43.3 % (ref 39.0–52.0)
Hemoglobin: 15.2 g/dL (ref 13.0–17.0)
Lymphocytes Relative: 11 % — ABNORMAL LOW (ref 12–46)
Lymphs Abs: 0.8 10*3/uL (ref 0.7–4.0)
MCH: 32.9 pg (ref 26.0–34.0)
MCHC: 35.1 g/dL (ref 30.0–36.0)
MCV: 93.7 fL (ref 78.0–100.0)
Monocytes Absolute: 0.7 10*3/uL (ref 0.1–1.0)
Monocytes Relative: 9 % (ref 3–12)
Neutro Abs: 5.9 10*3/uL (ref 1.7–7.7)
Neutrophils Relative %: 77 % (ref 43–77)
Platelets: 137 10*3/uL — ABNORMAL LOW (ref 150–400)
RBC: 4.62 MIL/uL (ref 4.22–5.81)
RDW: 12.8 % (ref 11.5–15.5)
WBC: 7.6 10*3/uL (ref 4.0–10.5)

## 2013-09-03 LAB — BASIC METABOLIC PANEL
BUN: 21 mg/dL (ref 6–23)
CO2: 27 meq/L (ref 19–32)
CREATININE: 1 mg/dL (ref 0.50–1.35)
Calcium: 9.1 mg/dL (ref 8.4–10.5)
Chloride: 106 mEq/L (ref 96–112)
GFR calc Af Amer: >90 mL/min (ref >90)
GFR calc non Af Amer: 78 mL/min — ABNORMAL LOW (ref >90)
GLUCOSE: 99 mg/dL (ref 70–99)
Potassium: 4.1 mEq/L (ref 3.7–5.3)
SODIUM: 143 meq/L (ref 137–147)

## 2013-09-03 LAB — PROTIME-INR
INR: 1.01 (ref 0.00–1.49)
Prothrombin Time: 13.1 seconds (ref 11.6–15.2)

## 2013-09-03 LAB — URINALYSIS, ROUTINE W REFLEX MICROSCOPIC
Bilirubin Urine: NEGATIVE
Glucose, UA: NEGATIVE mg/dL
Hgb urine dipstick: NEGATIVE
Ketones, ur: NEGATIVE mg/dL
Leukocytes, UA: NEGATIVE
Nitrite: NEGATIVE
Protein, ur: NEGATIVE mg/dL
Specific Gravity, Urine: 1.029 (ref 1.005–1.030)
Urobilinogen, UA: 0.2 mg/dL (ref 0.0–1.0)
pH: 6 (ref 5.0–8.0)

## 2013-09-03 LAB — TYPE AND SCREEN
ABO/RH(D): O POS
Antibody Screen: NEGATIVE

## 2013-09-03 MED ORDER — HYDROMORPHONE HCL PF 1 MG/ML IJ SOLN
1.0000 mg | INTRAMUSCULAR | Status: AC | PRN
  Administered 2013-09-03 (×2): 1 mg via INTRAVENOUS
  Filled 2013-09-03 (×2): qty 1

## 2013-09-03 MED ORDER — VITAMIN B-12 1000 MCG PO TABS
1000.0000 ug | ORAL_TABLET | Freq: Every evening | ORAL | Status: DC
  Administered 2013-09-05: 1000 ug via ORAL
  Filled 2013-09-03 (×3): qty 1

## 2013-09-03 MED ORDER — LISINOPRIL 40 MG PO TABS
40.0000 mg | ORAL_TABLET | Freq: Every evening | ORAL | Status: DC
  Administered 2013-09-05: 40 mg via ORAL
  Filled 2013-09-03 (×3): qty 1

## 2013-09-03 MED ORDER — MORPHINE SULFATE 2 MG/ML IJ SOLN
0.5000 mg | INTRAMUSCULAR | Status: DC | PRN
  Administered 2013-09-04: 0.5 mg via INTRAVENOUS
  Filled 2013-09-03: qty 1

## 2013-09-03 MED ORDER — HYDROMORPHONE HCL PF 1 MG/ML IJ SOLN
1.0000 mg | Freq: Once | INTRAMUSCULAR | Status: AC
Start: 2013-09-03 — End: 2013-09-03
  Administered 2013-09-03: 1 mg via INTRAVENOUS
  Filled 2013-09-03: qty 1

## 2013-09-03 MED ORDER — HYDROCODONE-ACETAMINOPHEN 5-325 MG PO TABS
1.0000 | ORAL_TABLET | Freq: Four times a day (QID) | ORAL | Status: DC | PRN
  Administered 2013-09-03 – 2013-09-04 (×3): 2 via ORAL
  Filled 2013-09-03 (×3): qty 2

## 2013-09-03 MED ORDER — SIMVASTATIN 20 MG PO TABS
20.0000 mg | ORAL_TABLET | Freq: Every day | ORAL | Status: DC
Start: 2013-09-04 — End: 2013-09-06
  Administered 2013-09-05: 20 mg via ORAL
  Filled 2013-09-03 (×3): qty 1

## 2013-09-03 MED ORDER — SODIUM CHLORIDE 0.9 % IV SOLN
INTRAVENOUS | Status: DC
  Administered 2013-09-03: 20 mL/h via INTRAVENOUS

## 2013-09-03 NOTE — ED Notes (Signed)
PA at bedside.

## 2013-09-03 NOTE — H&P (Signed)
[facility] History and Physical  James Schaefer WJX:[id number] DOB: Dec 27, 1950 DOA: 09/03/2013  Referring physician: ER physician. PCP: No primary provider on file. PCP in [place].  Chief Complaint: Fall with right hip pain.  HPI: James Schaefer is a 63 y.o. male history of hypertension, hyperlipidemia, previous history of atrial fibrillation, previous history of hyperthyroidism and multinodular goiter status post radioactive iodine ablation had a fall today after tripping. In the ER x-rays show right sided intertrochanteric fracture of the right hip. On-call orthopedic surgeon was consulted and patient is admitted for further workup. Patient denies any dizziness chest pain shortness of breath nausea vomiting abdominal pain focal deficits. Patient states that he tripped and fell did not hit his head or lose consciousness. In the ER EKG shows atrial fibrillation. Looking back to his chart he does have history of atrial fibrillation previously.   Review of Systems: As presented in the history of presenting illness, rest negative.  Past Medical History  Diagnosis Date  . Hypertension   . Hypercholesteremia    Past Surgical History  Procedure Laterality Date  . Knee replacements    . Hernia repair    . Penile pump     Social History:  reports that he quit smoking about 35 years ago. He quit smokeless tobacco use about 35 years ago. He reports that he drinks alcohol. He reports that he does not use illicit drugs. Where does patient live home. Can patient participate in ADLs? Yes.  No Known Allergies  Family History:  Family History  Problem Relation Age of Onset  . CAD Father   . Kidney failure Father   . Pancreatic cancer Maternal Uncle       Prior to Admission medications   Medication Sig Start Date End Date Taking? Authorizing Provider  aspirin EC 81 MG tablet Take 81 mg by mouth every evening.   Yes Historical Provider, MD  hydrochlorothiazide (HYDRODIURIL) 25 MG  tablet Take 25 mg by mouth every morning.   Yes Historical Provider, MD  lisinopril (PRINIVIL,ZESTRIL) 40 MG tablet Take 40 mg by mouth every evening.   Yes Historical Provider, MD  Multiple Vitamins-Minerals (MULTIVITAMIN WITH MINERALS) tablet Take 1 tablet by mouth every evening.   Yes Historical Provider, MD  pravastatin (PRAVACHOL) 40 MG tablet Take 40 mg by mouth every evening.   Yes Historical Provider, MD  vitamin B-12 (CYANOCOBALAMIN) 1000 MCG tablet Take 1,000 mcg by mouth every evening.   Yes Historical Provider, MD  vitamin E 400 UNIT capsule Take 400 Units by mouth every evening.   Yes Historical Provider, MD    Physical Exam: Filed Vitals:   09/03/13 2130 09/03/13 2148 09/03/13 2200 09/03/13 2230  BP: 134/85 138/89 128/79 136/74  Pulse: 89 94 95 79  Temp:      TempSrc:      Resp:    16  Height:      Weight:      SpO2: 97% 96% 96% 97%     General:  Well-developed and nourished.  Eyes: Anicteric no pallor.  ENT: No discharge from ears eyes nose mouth.  Neck: No mass felt.  Cardiovascular: S1-S2 heard.  Respiratory: No rhonchi or crepitations.  Abdomen: Soft nontender bowel sounds present.  Skin: No rash.  Musculoskeletal: Pain on moving right hip.  Psychiatric: Appears normal.  Neurologic: Alert awake oriented to time place and person. Moves all extremities.  Labs on Admission:  Basic Metabolic Panel:  Recent Labs Lab 09/03/13 1947  NA 143  K 4.1  CL 106  CO2 27  GLUCOSE 99  BUN 21  CREATININE 1.00  CALCIUM 9.1   Liver Function Tests: No results found for this basename: AST, ALT, ALKPHOS, BILITOT, PROT, ALBUMIN,  in the last 168 hours No results found for this basename: LIPASE, AMYLASE,  in the last 168 hours No results found for this basename: AMMONIA,  in the last 168 hours CBC:  Recent Labs Lab 09/03/13 1947  WBC 7.6  NEUTROABS 5.9  HGB 15.2  HCT 43.3  MCV 93.7  PLT 137*   Cardiac Enzymes: No results found for this basename:  CKTOTAL, CKMB, CKMBINDEX, TROPONINI,  in the last 168 hours  BNP (last 3 results) No results found for this basename: PROBNP,  in the last 8760 hours CBG: No results found for this basename: GLUCAP,  in the last 168 hours  Radiological Exams on Admission: Dg Pelvis 1-2 Views  09/03/2013   CLINICAL DATA:  Patient bloating truck. [place] with is put stuck in a pallet. Right hip and leg pain.  EXAM: PELVIS - 1-2 VIEW  COMPARISON:  None.  FINDINGS: Right proximal femur fracture. Refer to the right femur radiographs for further details.  No additional fractures. Hip joints are normally space and aligned. Bones are demineralized.  IMPRESSION: 1. Right proximal femur intertrochanteric fracture described in detail on the right femur radiographs. 2. No other fracture.  No dislocation.   Electronically Signed   By: Lajean Manes M.D.   On: 09/03/2013 21:21   Dg Femur Right  09/03/2013   CLINICAL DATA:  Fall with foot stuck in a pallete.  Right hip pain.  EXAM: RIGHT FEMUR - 2 VIEW  COMPARISON:  None.  FINDINGS: There is a fracture of the proximal femur. This is in trochanteric with evidence of a separate fracture line across the base of the femoral neck. Fracture is mildly displaced, approximately 14 mm. There is mild varus angulation.  No other fracture.  Hip joint is normally space and aligned.  Right knee prosthesis appears well seated and aligned.  IMPRESSION: Intertrochanteric fracture of the proximal right femur as described.   Electronically Signed   By: Lajean Manes M.D.   On: 09/03/2013 21:20    EKG: Independently reviewed. Atrial fibrillation rate controlled.  Assessment/Plan Principal Problem:   Hip fracture, intertrochanteric Active Problems:   HYPERCHOLESTEROLEMIA   HYPERTENSION   Hip fracture   1. Right hip fracture status post mechanical fall - from medical standpoint of view patient stable for surgery. Patient has been placed on pain relief medications. N.p.o. past midnight. Further  recommendations per orthopedic surgery. DVT prophylaxis presently on SCD. 2. Atrial fibrillation rate controlled - patient does not recall having arrhythmias. Patient's chart mentions previous history of atrial fibrillation in his previous office visits with endocrinologist. Closely observe. 3. Hypertension - holding off diuretics for now until surgery. Continue lisinopril. 4. Hyperlipidemia - continue statins. 5. History of multinodular goiter and hyperthyroidism status post reactive iodine ablation - check thyroid function test.  I have reviewed patient's old charts and labs.  Code Status: Full code.  Family Communication: Patient's wife at the bedside.  Disposition Plan: Admit to inpatient.    Dionis Autry N. [facility] Pager [phone].  If 7PM-7AM, please contact night-coverage [email or web address] Password [facility] 09/03/2013, 11:08 PM

## 2013-09-03 NOTE — ED Provider Notes (Signed)
CSN: [id number]     Arrival date & time 09/03/13  1859 History   First MD Initiated Contact with Patient 09/03/13 2026     Chief Complaint  Patient presents with  . Fall  . Leg Pain     (Consider location/radiation/quality/duration/timing/severity/associated sxs/prior Treatment) HPI James Schaefer is a 63 y.o. male who presents emergency department complaining of a fall. Patient states he was at work and was [employer], states he tripped over a pallet and fell onto his right side. States she's having severe pain in the right hip. He denies hitting his head. He denies loss of consciousness. He denies any dizziness or lightheadedness. He denies any other injuries or pain in any other joints. He states he has severe pain with movement of his right leg at the hip joint. He states he feels "crampy." Patient states that he is unable to walk on his leg. He denies any numbness or weakness distal to injury.  Past Medical History  Diagnosis Date  . Hypertension   . Hypercholesteremia    Past Surgical History  Procedure Laterality Date  . Knee replacements    . Hernia repair     History reviewed. No pertinent family history. History  Substance Use Topics  . Smoking status: Former Smoker    Quit date: 09/04/1978  . Smokeless tobacco: Former Systems developer    Quit date: 09/04/1978  . Alcohol Use: Yes     Comment: occassional    Review of Systems  Constitutional: Negative for fever and chills.  Respiratory: Negative for cough, chest tightness and shortness of breath.   Cardiovascular: Negative for chest pain, palpitations and leg swelling.  Gastrointestinal: Negative for nausea, vomiting, abdominal pain, diarrhea and abdominal distention.  Musculoskeletal: Positive for arthralgias. Negative for myalgias, neck pain and neck stiffness.  Skin: Negative for rash.  Allergic/Immunologic: Negative for immunocompromised state.  Neurological: Negative for dizziness, weakness, light-headedness, numbness  and headaches.  All other systems reviewed and are negative.      Allergies  Review of patient's allergies indicates no known allergies.  Home Medications   Current Outpatient Rx  Name  Route  Sig  Dispense  Refill  . aspirin EC 81 MG tablet   Oral   Take 81 mg by mouth every evening.         . hydrochlorothiazide (HYDRODIURIL) 25 MG tablet   Oral   Take 25 mg by mouth every morning.         Marland Kitchen lisinopril (PRINIVIL,ZESTRIL) 40 MG tablet   Oral   Take 40 mg by mouth every evening.         . Multiple Vitamins-Minerals (MULTIVITAMIN WITH MINERALS) tablet   Oral   Take 1 tablet by mouth every evening.         . pravastatin (PRAVACHOL) 40 MG tablet   Oral   Take 40 mg by mouth every evening.         . vitamin B-12 (CYANOCOBALAMIN) 1000 MCG tablet   Oral   Take 1,000 mcg by mouth every evening.         . vitamin E 400 UNIT capsule   Oral   Take 400 Units by mouth every evening.          BP 135/84  Pulse 94  Temp(Src) 98.6 F (37 C) (Oral)  Resp 18  Ht 6\' 5"  (1.956 m)  Wt 315 lb (142.883 kg)  BMI 37.35 kg/m2  SpO2 98% Physical Exam  Nursing note and vitals reviewed. Constitutional:  He appears well-developed and well-nourished. No distress.  HENT:  Head: Normocephalic and atraumatic.  Eyes: Conjunctivae are normal.  Neck: Neck supple.  Cardiovascular: Normal rate, regular rhythm and normal heart sounds.   Pulmonary/Chest: Effort normal. No respiratory distress. He has no wheezes. He has no rales.  Musculoskeletal: He exhibits no edema.  Tenderness over the greater trochanter of the right hip. No obvious deformity noted. No bruising or swelling. Unable to check range of motion of the right hip due to severe pain with any of the movement. There is no tenderness to palpation over right knee or right ankle joints. Full visualization at the right ankle joint. There is a small abrasion to the right anterior knee. Dorsal pedal pulses intact. Sensation  intact distal to the knee.  Neurological: He is alert.  Skin: Skin is warm and dry.    ED Course  Procedures (including critical care time) Labs Review Labs Reviewed  BASIC METABOLIC PANEL - Abnormal; Notable for the following:    GFR calc non Af Amer 78 (*)    All other components within normal limits  CBC WITH DIFFERENTIAL - Abnormal; Notable for the following:    Platelets 137 (*)    Lymphocytes Relative 11 (*)    All other components within normal limits  URINE CULTURE  PROTIME-INR  URINALYSIS, ROUTINE W REFLEX MICROSCOPIC  TYPE AND SCREEN   Imaging Review Dg Pelvis 1-2 Views  09/03/2013   CLINICAL DATA:  Patient bloating truck. [place] [address] with is put stuck in a pallet. Right hip and leg pain.  EXAM: PELVIS - 1-2 VIEW  COMPARISON:  None.  FINDINGS: Right proximal femur fracture. Refer to the right femur radiographs for further details.  No additional fractures. Hip joints are normally space and aligned. Bones are demineralized.  IMPRESSION: 1. Right proximal femur intertrochanteric fracture described in detail on the right femur radiographs. 2. No other fracture.  No dislocation.   Electronically Signed   By: Lajean Manes M.D.   On: 09/03/2013 21:21   Dg Femur Right  09/03/2013   CLINICAL DATA:  Fall with foot stuck in a pallete.  Right hip pain.  EXAM: RIGHT FEMUR - 2 VIEW  COMPARISON:  None.  FINDINGS: There is a fracture of the proximal femur. This is in trochanteric with evidence of a separate fracture line across the base of the femoral neck. Fracture is mildly displaced, approximately 14 mm. There is mild varus angulation.  No other fracture.  Hip joint is normally space and aligned.  Right knee prosthesis appears well seated and aligned.  IMPRESSION: Intertrochanteric fracture of the proximal right femur as described.   Electronically Signed   By: Lajean Manes M.D.   On: 09/03/2013 21:20     EKG Interpretation None      MDM   Final diagnoses:  Intertrochanteric fracture of  right femur      9:46 PM Pt with right hip fracture on x-rays. Pain controlled at present. Spoke with Dr. Libby Maw, with orthopedics surgery, will take to OR most likely tomorrow AM. Make sure Pt NPO after midnight.   10:32 PM Discussed with [facility], will admit. Pt's pain is managed with dilaudid. Currently under control.    Filed Vitals:   09/03/13 2016 09/03/13 2030 09/03/13 2130 09/03/13 2148  BP: 135/84 138/88 134/85 138/89  Pulse: 94 89 89 94  Temp:      TempSrc:      Resp: 18     Height:  Weight:      SpO2: 98% 99% 97% 96%     Renold Genta, PA-C 09/03/13 2233

## 2013-09-03 NOTE — ED Notes (Signed)
Flow called and informed pt's bed request needs to be changed to telemetry, pt's HR Atrial Fib.

## 2013-09-03 NOTE — ED Provider Notes (Signed)
MSE was initiated and I personally evaluated the patient and placed orders (if any) at  7:41 PM on September 03, 2013. Pt complains of R hip pain after a mechanical fall. NVI distally.  The patient appears stable so that the remainder of the MSE may be completed by another provider.  Neta Ehlers, MD 09/03/13 [phone]

## 2013-09-03 NOTE — ED Notes (Signed)
[facility] presents with a 63 yo male from work with a fall and right leg pain.  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  Pt can straighten right leg, pain upon straightening and rotation of right leg.  No obvious deformities.  7 out of 10 pain at rest.  Hx of HTN. BP was 190/140 with [facility]; pt was about to take BP meds at time of incident.

## 2013-09-04 ENCOUNTER — Encounter (HOSPITAL_COMMUNITY): Payer: Worker's Compensation | Admitting: Anesthesiology

## 2013-09-04 ENCOUNTER — Inpatient Hospital Stay: Admit: 2013-09-04 | Payer: Self-pay | Admitting: Orthopedic Surgery

## 2013-09-04 ENCOUNTER — Encounter (HOSPITAL_COMMUNITY): Payer: Self-pay | Admitting: Certified Registered Nurse Anesthetist

## 2013-09-04 ENCOUNTER — Inpatient Hospital Stay (HOSPITAL_COMMUNITY): Payer: Worker's Compensation

## 2013-09-04 ENCOUNTER — Encounter (HOSPITAL_COMMUNITY)
Admission: EM | Disposition: A | Discharge status: Skilled Nursing Facility | Payer: Self-pay | Source: Home / Self Care | Attending: Internal Medicine

## 2013-09-04 ENCOUNTER — Inpatient Hospital Stay (HOSPITAL_COMMUNITY): Payer: Worker's Compensation | Admitting: Anesthesiology

## 2013-09-04 HISTORY — PX: INTRAMEDULLARY (IM) NAIL INTERTROCHANTERIC: SHX5875

## 2013-09-04 LAB — COMPREHENSIVE METABOLIC PANEL
ALT: 18 U/L (ref 0–53)
AST: 18 U/L (ref 0–37)
Albumin: 3.4 g/dL — ABNORMAL LOW (ref 3.5–5.2)
Alkaline Phosphatase: 57 U/L (ref 39–117)
BUN: 20 mg/dL (ref 6–23)
CALCIUM: 9 mg/dL (ref 8.4–10.5)
CO2: 26 meq/L (ref 19–32)
CREATININE: 0.93 mg/dL (ref 0.50–1.35)
Chloride: 106 mEq/L (ref 96–112)
GFR, EST NON AFRICAN AMERICAN: 88 mL/min — AB (ref >90)
GLUCOSE: 121 mg/dL — AB (ref 70–99)
Potassium: 4 mEq/L (ref 3.7–5.3)
Sodium: 144 mEq/L (ref 137–147)
Total Bilirubin: 0.7 mg/dL (ref 0.3–1.2)
Total Protein: 6.3 g/dL (ref 6.0–8.3)

## 2013-09-04 LAB — T3, FREE: T3 FREE: 3 pg/mL (ref 2.3–4.2)

## 2013-09-04 LAB — APTT: APTT: 31 s (ref 24–37)

## 2013-09-04 LAB — TSH: TSH: 6.076 u[IU]/mL — ABNORMAL HIGH (ref 0.350–4.500)

## 2013-09-04 LAB — PROTIME-INR
INR: 1.07 (ref 0.00–1.49)
Prothrombin Time: 13.7 seconds (ref 11.6–15.2)

## 2013-09-04 LAB — T4, FREE: Free T4: 0.97 ng/dL (ref 0.80–1.80)

## 2013-09-04 LAB — MRSA PCR SCREENING: MRSA by PCR: NEGATIVE

## 2013-09-04 LAB — VITAMIN D 25 HYDROXY (VIT D DEFICIENCY, FRACTURES): Vit D, 25-Hydroxy: 44 ng/mL (ref 30–89)

## 2013-09-04 SURGERY — FIXATION, FRACTURE, INTERTROCHANTERIC, WITH INTRAMEDULLARY ROD
Anesthesia: General | Site: Hip | Laterality: Right

## 2013-09-04 MED ORDER — PHENYLEPHRINE HCL 10 MG/ML IJ SOLN
INTRAMUSCULAR | Status: DC | PRN
  Administered 2013-09-04 (×4): 80 ug via INTRAVENOUS

## 2013-09-04 MED ORDER — ONDANSETRON HCL 4 MG/2ML IJ SOLN
INTRAMUSCULAR | Status: DC | PRN
  Administered 2013-09-04: 4 mg via INTRAVENOUS

## 2013-09-04 MED ORDER — ONDANSETRON HCL 4 MG/2ML IJ SOLN
INTRAMUSCULAR | Status: AC
  Filled 2013-09-04: qty 2

## 2013-09-04 MED ORDER — PHENYLEPHRINE HCL 10 MG/ML IJ SOLN
INTRAMUSCULAR | Status: AC
  Filled 2013-09-04: qty 1

## 2013-09-04 MED ORDER — PHENYLEPHRINE 40 MCG/ML (10ML) SYRINGE FOR IV PUSH (FOR BLOOD PRESSURE SUPPORT)
PREFILLED_SYRINGE | INTRAVENOUS | Status: AC
  Filled 2013-09-04: qty 10

## 2013-09-04 MED ORDER — OXYCODONE HCL 5 MG PO TABS: 5.0000 mg | ORAL_TABLET | Freq: Once | ORAL | Status: DC | PRN

## 2013-09-04 MED ORDER — OXYCODONE HCL 5 MG PO TABS
5.0000 mg | ORAL_TABLET | ORAL | Status: DC | PRN
  Administered 2013-09-04 – 2013-09-05 (×4): 10 mg via ORAL
  Filled 2013-09-04 (×5): qty 2

## 2013-09-04 MED ORDER — FENTANYL CITRATE 0.05 MG/ML IJ SOLN
INTRAMUSCULAR | Status: AC
  Filled 2013-09-04: qty 5

## 2013-09-04 MED ORDER — HYDROMORPHONE HCL PF 1 MG/ML IJ SOLN
1.0000 mg | INTRAMUSCULAR | Status: DC | PRN
  Administered 2013-09-05: 1 mg via INTRAVENOUS
  Filled 2013-09-04: qty 1

## 2013-09-04 MED ORDER — GLYCOPYRROLATE 0.2 MG/ML IJ SOLN
INTRAMUSCULAR | Status: AC
  Filled 2013-09-04: qty 1

## 2013-09-04 MED ORDER — STERILE WATER FOR INJECTION IJ SOLN
INTRAMUSCULAR | Status: AC
  Filled 2013-09-04: qty 10

## 2013-09-04 MED ORDER — OXYCODONE-ACETAMINOPHEN 5-325 MG PO TABS: 1.0000 | ORAL_TABLET | ORAL | Status: DC | PRN

## 2013-09-04 MED ORDER — METHOCARBAMOL 500 MG PO TABS
500.0000 mg | ORAL_TABLET | Freq: Four times a day (QID) | ORAL | Status: DC | PRN
Start: 2013-09-04 — End: 2013-09-08
  Administered 2013-09-04 – 2013-09-08 (×5): 500 mg via ORAL
  Filled 2013-09-04 (×7): qty 1

## 2013-09-04 MED ORDER — GLYCOPYRROLATE 0.2 MG/ML IJ SOLN
INTRAMUSCULAR | Status: DC | PRN
  Administered 2013-09-04: .8 mg via INTRAVENOUS

## 2013-09-04 MED ORDER — PROPOFOL 10 MG/ML IV BOLUS
INTRAVENOUS | Status: AC
  Filled 2013-09-04: qty 20

## 2013-09-04 MED ORDER — DEXTROSE 5 % IV SOLN
3.0000 g | INTRAVENOUS | Status: AC
  Administered 2013-09-04: 3 g via INTRAVENOUS
  Filled 2013-09-04: qty 3000

## 2013-09-04 MED ORDER — LIDOCAINE HCL (CARDIAC) 20 MG/ML IV SOLN
INTRAVENOUS | Status: AC
  Filled 2013-09-04: qty 5

## 2013-09-04 MED ORDER — ENOXAPARIN SODIUM 40 MG/0.4ML ~~LOC~~ SOLN
40.0000 mg | SUBCUTANEOUS | Status: DC
  Administered 2013-09-05: 40 mg via SUBCUTANEOUS
  Filled 2013-09-04 (×2): qty 0.4

## 2013-09-04 MED ORDER — GLYCOPYRROLATE 0.2 MG/ML IJ SOLN
INTRAMUSCULAR | Status: AC
  Filled 2013-09-04: qty 4

## 2013-09-04 MED ORDER — MENTHOL 3 MG MT LOZG: 1.0000 | LOZENGE | OROMUCOSAL | Status: DC | PRN

## 2013-09-04 MED ORDER — ENOXAPARIN SODIUM 40 MG/0.4ML ~~LOC~~ SOLN: 40.0000 mg | SUBCUTANEOUS | Status: DC

## 2013-09-04 MED ORDER — LACTATED RINGERS IV SOLN
INTRAVENOUS | Status: DC
  Administered 2013-09-04 – 2013-09-07 (×4): via INTRAVENOUS

## 2013-09-04 MED ORDER — METOCLOPRAMIDE HCL 10 MG PO TABS: 5.0000 mg | ORAL_TABLET | Freq: Three times a day (TID) | ORAL | Status: DC | PRN

## 2013-09-04 MED ORDER — PHENOL 1.4 % MT LIQD: 1.0000 | OROMUCOSAL | Status: DC | PRN

## 2013-09-04 MED ORDER — LIDOCAINE HCL (CARDIAC) 20 MG/ML IV SOLN
INTRAVENOUS | Status: DC | PRN
  Administered 2013-09-04: 100 mg via INTRAVENOUS

## 2013-09-04 MED ORDER — EPHEDRINE SULFATE 50 MG/ML IJ SOLN
INTRAMUSCULAR | Status: AC
  Filled 2013-09-04: qty 1

## 2013-09-04 MED ORDER — DEXTROSE 5 % IV SOLN
500.0000 mg | Freq: Four times a day (QID) | INTRAVENOUS | Status: DC | PRN
  Administered 2013-09-07: 500 mg via INTRAVENOUS
  Filled 2013-09-04 (×2): qty 5

## 2013-09-04 MED ORDER — ACETAMINOPHEN 325 MG PO TABS
650.0000 mg | ORAL_TABLET | Freq: Four times a day (QID) | ORAL | Status: DC | PRN
  Administered 2013-09-08 (×2): 650 mg via ORAL
  Filled 2013-09-04 (×2): qty 2

## 2013-09-04 MED ORDER — METHOCARBAMOL 500 MG PO TABS: 500.0000 mg | ORAL_TABLET | Freq: Four times a day (QID) | ORAL | Status: DC | PRN

## 2013-09-04 MED ORDER — ACETAMINOPHEN 650 MG RE SUPP
650.0000 mg | Freq: Four times a day (QID) | RECTAL | Status: DC | PRN
  Administered 2013-09-06: 650 mg via RECTAL
  Filled 2013-09-04: qty 1

## 2013-09-04 MED ORDER — NEOSTIGMINE METHYLSULFATE 1 MG/ML IJ SOLN
INTRAMUSCULAR | Status: DC | PRN
Start: 2013-09-04 — End: 2013-09-04
  Administered 2013-09-04: 5 mg via INTRAVENOUS

## 2013-09-04 MED ORDER — MIDAZOLAM HCL 2 MG/2ML IJ SOLN
INTRAMUSCULAR | Status: AC
  Filled 2013-09-04: qty 2

## 2013-09-04 MED ORDER — PHENYLEPHRINE HCL 10 MG/ML IJ SOLN
10.0000 mg | INTRAVENOUS | Status: DC | PRN
  Administered 2013-09-04: 20 ug/min via INTRAVENOUS

## 2013-09-04 MED ORDER — PROPOFOL 10 MG/ML IV BOLUS
INTRAVENOUS | Status: DC | PRN
  Administered 2013-09-04: 200 mg via INTRAVENOUS

## 2013-09-04 MED ORDER — CEFAZOLIN SODIUM-DEXTROSE 2-3 GM-% IV SOLR
2.0000 g | Freq: Four times a day (QID) | INTRAVENOUS | Status: AC
  Administered 2013-09-04 – 2013-09-05 (×2): 2 g via INTRAVENOUS
  Filled 2013-09-04 (×2): qty 50

## 2013-09-04 MED ORDER — HYDROMORPHONE HCL PF 1 MG/ML IJ SOLN
0.2500 mg | INTRAMUSCULAR | Status: DC | PRN
  Administered 2013-09-04 (×4): 0.5 mg via INTRAVENOUS

## 2013-09-04 MED ORDER — HYDROMORPHONE HCL PF 1 MG/ML IJ SOLN
INTRAMUSCULAR | Status: AC
  Filled 2013-09-04: qty 1

## 2013-09-04 MED ORDER — MIDAZOLAM HCL 5 MG/5ML IJ SOLN
INTRAMUSCULAR | Status: DC | PRN
  Administered 2013-09-04: 2 mg via INTRAVENOUS

## 2013-09-04 MED ORDER — METOCLOPRAMIDE HCL 5 MG/ML IJ SOLN
5.0000 mg | Freq: Three times a day (TID) | INTRAMUSCULAR | Status: DC | PRN
  Administered 2013-09-05: 10 mg via INTRAVENOUS
  Filled 2013-09-04: qty 2

## 2013-09-04 MED ORDER — ONDANSETRON HCL 4 MG/2ML IJ SOLN
4.0000 mg | Freq: Four times a day (QID) | INTRAMUSCULAR | Status: AC | PRN
  Administered 2013-09-04: 4 mg via INTRAVENOUS

## 2013-09-04 MED ORDER — ROCURONIUM BROMIDE 50 MG/5ML IV SOLN
INTRAVENOUS | Status: AC
  Filled 2013-09-04: qty 1

## 2013-09-04 MED ORDER — DOCUSATE SODIUM 100 MG PO CAPS
100.0000 mg | ORAL_CAPSULE | Freq: Two times a day (BID) | ORAL | Status: DC
  Administered 2013-09-04 – 2013-09-05 (×3): 100 mg via ORAL
  Filled 2013-09-04 (×5): qty 1

## 2013-09-04 MED ORDER — NEOSTIGMINE METHYLSULFATE 1 MG/ML IJ SOLN
INTRAMUSCULAR | Status: AC
  Filled 2013-09-04: qty 10

## 2013-09-04 MED ORDER — ROCURONIUM BROMIDE 100 MG/10ML IV SOLN
INTRAVENOUS | Status: DC | PRN
  Administered 2013-09-04: 50 mg via INTRAVENOUS

## 2013-09-04 MED ORDER — FENTANYL CITRATE 0.05 MG/ML IJ SOLN
INTRAMUSCULAR | Status: DC | PRN
  Administered 2013-09-04: 100 ug via INTRAVENOUS
  Administered 2013-09-04 (×2): 50 ug via INTRAVENOUS

## 2013-09-04 MED ORDER — LACTATED RINGERS IV SOLN
INTRAVENOUS | Status: DC | PRN
  Administered 2013-09-04 (×2): via INTRAVENOUS

## 2013-09-04 MED ORDER — OXYCODONE HCL 5 MG/5ML PO SOLN: 5.0000 mg | Freq: Once | ORAL | Status: DC | PRN

## 2013-09-04 MED ORDER — 0.9 % SODIUM CHLORIDE (POUR BTL) OPTIME
TOPICAL | Status: DC | PRN
  Administered 2013-09-04: 1000 mL

## 2013-09-04 SURGICAL SUPPLY — 39 items
COVER MAYO STAND STRL (DRAPES) IMPLANT
COVER SURGICAL LIGHT HANDLE (MISCELLANEOUS) ×3 IMPLANT
DRAPE ORTHO SPLIT 77X108 STRL (DRAPES)
DRAPE PROXIMA HALF (DRAPES) ×6 IMPLANT
DRAPE STERI IOBAN 125X83 (DRAPES) IMPLANT
DRAPE SURG ORHT 6 SPLT 77X108 (DRAPES) IMPLANT
DRSG ADAPTIC 3X8 NADH LF (GAUZE/BANDAGES/DRESSINGS) ×3 IMPLANT
DRSG MEPILEX BORDER 4X4 (GAUZE/BANDAGES/DRESSINGS) ×3 IMPLANT
DRSG MEPILEX BORDER 4X8 (GAUZE/BANDAGES/DRESSINGS) ×3 IMPLANT
DURAPREP 26ML APPLICATOR (WOUND CARE) ×3 IMPLANT
ELECT REM PT RETURN 9FT ADLT (ELECTROSURGICAL) ×3
ELECTRODE REM PT RTRN 9FT ADLT (ELECTROSURGICAL) ×1 IMPLANT
EVACUATOR 1/8 PVC DRAIN (DRAIN) IMPLANT
GLOVE BIOGEL M STRL SZ7.5 (GLOVE) ×3 IMPLANT
GLOVE BIOGEL PI IND STRL 8 (GLOVE) ×4 IMPLANT
GLOVE BIOGEL PI INDICATOR 8 (GLOVE) ×8
GLOVE ORTHO TXT STRL SZ7.5 (GLOVE) ×18 IMPLANT
GLOVE SURG ORTHO 8.0 STRL STRW (GLOVE) ×12 IMPLANT
GOWN PREVENTION PLUS XLARGE (GOWN DISPOSABLE) ×9 IMPLANT
GOWN STRL NON-REIN LRG LVL3 (GOWN DISPOSABLE) ×6 IMPLANT
GUIDEPIN 3.2X17.5 THRD DISP (PIN) ×4 IMPLANT
GUIDEWIRE BALL NOSE 100CM (WIRE) ×2 IMPLANT
KIT BASIN OR (CUSTOM PROCEDURE TRAY) ×3 IMPLANT
KIT ROOM TURNOVER OR (KITS) ×3 IMPLANT
MANIFOLD NEPTUNE II (INSTRUMENTS) ×3 IMPLANT
NAIL HIP FRACTURE 11X380MM (Nail) ×2 IMPLANT
NS IRRIG 1000ML POUR BTL (IV SOLUTION) ×3 IMPLANT
PACK GENERAL/GYN (CUSTOM PROCEDURE TRAY) ×3 IMPLANT
PAD ARMBOARD 7.5X6 YLW CONV (MISCELLANEOUS) ×6 IMPLANT
SCREW ANIT ROTATION 100MM HIP (Screw) ×2 IMPLANT
SCREW DRILL BIT ANIT ROTATION (BIT) ×2 IMPLANT
SCREW LAG 10.5X120MM (Screw) ×2 IMPLANT
STAPLER VISISTAT 35W (STAPLE) ×3 IMPLANT
SUT VIC AB 0 CT1 27 (SUTURE) ×3
SUT VIC AB 0 CT1 27XBRD ANBCTR (SUTURE) IMPLANT
SUT VIC AB 1 CTB1 27 (SUTURE) ×6 IMPLANT
SUT VIC AB 2-0 CT1 27 (SUTURE) ×6
SUT VIC AB 2-0 CT1 TAPERPNT 27 (SUTURE) ×2 IMPLANT
WATER STERILE IRR 1000ML POUR (IV SOLUTION) ×9 IMPLANT

## 2013-09-04 NOTE — Consult Note (Signed)
Reason for Consult:R hip fracture Referring Physician: ED  James Schaefer is an 63 y.o. male.  HPI: James Schaefer is a 63 y.o. male history of hypertension, hyperlipidemia, previous history of atrial fibrillation, previous history of hyperthyroidism and multinodular goiter status post radioactive iodine ablation had a fall today after tripping.  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 In the ER x-rays show right sided intertrochanteric fracture of the right hip.  History of bilat knee replacement by Dr. Ronnie Derby doing well with these.  Patient was admitted for further workup by medicine team. Patient denies any dizziness chest pain shortness of breath nausea vomiting abdominal pain focal deficits. Patient states that he tripped and fell did not hit his head or lose consciousness. In the ER EKG shows atrial fibrillation. Looking back to his chart he does have history of atrial fibrillation previously.    Past Medical History  Diagnosis Date  . Hypertension   . Hypercholesteremia     Past Surgical History  Procedure Laterality Date  . Knee replacements    . Hernia repair    . Penile pump      Family History  Problem Relation Age of Onset  . CAD Father   . Kidney failure Father   . Pancreatic cancer Maternal Uncle     Social History:  reports that he quit smoking about 35 years ago. He quit smokeless tobacco use about 35 years ago. He reports that he drinks alcohol. He reports that he does not use illicit drugs.  Allergies: No Known Allergies  Medications: I have reviewed the patient's current medications.  Results for orders placed during the hospital encounter of 09/03/13 (from the past 48 hour(s))  BASIC METABOLIC PANEL     Status: Abnormal   Collection Time    09/03/13  7:47 PM      Result Value Ref Range   Sodium 143  137 - 147 mEq/L   Potassium 4.1  3.7  - 5.3 mEq/L   Chloride 106  96 - 112 mEq/L   CO2 27  19 - 32 mEq/L   Glucose, Bld 99  70 - 99 mg/dL   BUN 21  6 - 23 mg/dL   Creatinine, Ser 1.00  0.50 - 1.35 mg/dL   Calcium 9.1  8.4 - 10.5 mg/dL   GFR calc non Af Amer 78 (*) >90 mL/min   GFR calc Af Amer >90  >90 mL/min   Comment: (NOTE)     The eGFR has been calculated using the CKD EPI equation.     This calculation has not been validated in all clinical situations.     eGFR's persistently <90 mL/min signify possible Chronic Kidney     Disease.  CBC WITH DIFFERENTIAL     Status: Abnormal   Collection Time    09/03/13  7:47 PM      Result Value Ref Range   WBC 7.6  4.0 - 10.5 K/uL   RBC 4.62  4.22 - 5.81 MIL/uL   Hemoglobin 15.2  13.0 - 17.0 g/dL   HCT 43.3  39.0 - 52.0 %   MCV 93.7  78.0 - 100.0 fL   MCH 32.9  26.0 - 34.0 pg   MCHC 35.1  30.0 - 36.0 g/dL   RDW 12.8  11.5 - 15.5 %   Platelets 137 (*) 150 - 400 K/uL  Neutrophils Relative % 77  43 - 77 %   Neutro Abs 5.9  1.7 - 7.7 K/uL   Lymphocytes Relative 11 (*) 12 - 46 %   Lymphs Abs 0.8  0.7 - 4.0 K/uL   Monocytes Relative 9  3 - 12 %   Monocytes Absolute 0.7  0.1 - 1.0 K/uL   Eosinophils Relative 3  0 - 5 %   Eosinophils Absolute 0.2  0.0 - 0.7 K/uL   Basophils Relative 0  0 - 1 %   Basophils Absolute 0.0  0.0 - 0.1 K/uL  PROTIME-INR     Status: None   Collection Time    09/03/13  7:47 PM      Result Value Ref Range   Prothrombin Time 13.1  11.6 - 15.2 seconds   INR 1.01  0.00 - 1.49  TYPE AND SCREEN     Status: None   Collection Time    09/03/13  7:47 PM      Result Value Ref Range   ABO/RH(D) O POS     Antibody Screen NEG     Sample Expiration 09/06/2013    URINALYSIS, ROUTINE W REFLEX MICROSCOPIC     Status: None   Collection Time    09/03/13 10:19 PM      Result Value Ref Range   Color, Urine YELLOW  YELLOW   APPearance CLEAR  CLEAR   Specific Gravity, Urine 1.029  1.005 - 1.030   pH 6.0  5.0 - 8.0   Glucose, UA NEGATIVE  NEGATIVE mg/dL   Hgb  urine dipstick NEGATIVE  NEGATIVE   Bilirubin Urine NEGATIVE  NEGATIVE   Ketones, ur NEGATIVE  NEGATIVE mg/dL   Protein, ur NEGATIVE  NEGATIVE mg/dL   Urobilinogen, UA 0.2  0.0 - 1.0 mg/dL   Nitrite NEGATIVE  NEGATIVE   Leukocytes, UA NEGATIVE  NEGATIVE   Comment: MICROSCOPIC NOT DONE ON URINES WITH NEGATIVE PROTEIN, BLOOD, LEUKOCYTES, NITRITE, OR GLUCOSE <1000 mg/dL.  MRSA PCR SCREENING     Status: None   Collection Time    09/04/13 12:25 AM      Result Value Ref Range   MRSA by PCR NEGATIVE  NEGATIVE   Comment:            The GeneXpert MRSA Assay (FDA     approved for NASAL specimens     only), is one component of a     comprehensive MRSA colonization     surveillance program. It is not     intended to diagnose MRSA     infection nor to guide or     monitor treatment for     MRSA infections.  PROTIME-INR     Status: None   Collection Time    09/04/13  5:35 AM      Result Value Ref Range   Prothrombin Time 13.7  11.6 - 15.2 seconds   INR 1.07  0.00 - 1.49  COMPREHENSIVE METABOLIC PANEL     Status: Abnormal   Collection Time    09/04/13  5:35 AM      Result Value Ref Range   Sodium 144  137 - 147 mEq/L   Potassium 4.0  3.7 - 5.3 mEq/L   Chloride 106  96 - 112 mEq/L   CO2 26  19 - 32 mEq/L   Glucose, Bld 121 (*) 70 - 99 mg/dL   BUN 20  6 - 23 mg/dL   Creatinine, Ser 0.93  0.50 - 1.35 mg/dL  Calcium 9.0  8.4 - 10.5 mg/dL   Total Protein 6.3  6.0 - 8.3 g/dL   Albumin 3.4 (*) 3.5 - 5.2 g/dL   AST 18  0 - 37 U/L   ALT 18  0 - 53 U/L   Alkaline Phosphatase 57  39 - 117 U/L   Total Bilirubin 0.7  0.3 - 1.2 mg/dL   GFR calc non Af Amer 88 (*) >90 mL/min   GFR calc Af Amer >90  >90 mL/min   Comment: (NOTE)     The eGFR has been calculated using the CKD EPI equation.     This calculation has not been validated in all clinical situations.     eGFR's persistently <90 mL/min signify possible Chronic Kidney     Disease.  APTT     Status: None   Collection Time    09/04/13   5:35 AM      Result Value Ref Range   aPTT 31  24 - 37 seconds    Dg Pelvis 1-2 Views  09/03/2013   CLINICAL DATA:  Patient bloating truck. [place] with is put stuck in a pallet. Right hip and leg pain.  EXAM: PELVIS - 1-2 VIEW  COMPARISON:  None.  FINDINGS: Right proximal femur fracture. Refer to the right femur radiographs for further details.  No additional fractures. Hip joints are normally space and aligned. Bones are demineralized.  IMPRESSION: 1. Right proximal femur intertrochanteric fracture described in detail on the right femur radiographs. 2. No other fracture.  No dislocation.   Electronically Signed   By: Lajean Manes M.D.   On: 09/03/2013 21:21   Dg Femur Right  09/03/2013   CLINICAL DATA:  Fall with foot stuck in a pallete.  Right hip pain.  EXAM: RIGHT FEMUR - 2 VIEW  COMPARISON:  None.  FINDINGS: There is a fracture of the proximal femur. This is in trochanteric with evidence of a separate fracture line across the base of the femoral neck. Fracture is mildly displaced, approximately 14 mm. There is mild varus angulation.  No other fracture.  Hip joint is normally space and aligned.  Right knee prosthesis appears well seated and aligned.  IMPRESSION: Intertrochanteric fracture of the proximal right femur as described.   Electronically Signed   By: Lajean Manes M.D.   On: 09/03/2013 21:20   Dg Chest Portable 1 View  09/03/2013   CLINICAL DATA:  Fall.  EXAM: PORTABLE CHEST - 1 VIEW  COMPARISON:  June 11, 2008.  FINDINGS: The heart size and mediastinal contours are within normal limits. Both lungs are clear. The visualized skeletal structures are unremarkable. Lateral portion of left lung base is not included in field of view.  IMPRESSION: No acute cardiopulmonary abnormality seen.   Electronically Signed   By: Sabino Dick M.D.   On: 09/03/2013 23:07    Review of Systems  Constitutional: Negative.   HENT: Negative.   Eyes: Negative.   Respiratory: Negative.   Cardiovascular:  Negative.   Gastrointestinal: Negative.   Genitourinary: Negative.   Musculoskeletal: Positive for falls and joint pain. Negative for back pain and neck pain.  Skin: Negative.   Neurological: Negative.   Endo/Heme/Allergies: Negative.   Psychiatric/Behavioral: Negative.    Blood pressure 127/76, pulse 85, temperature 98.1 F (36.7 C), temperature source Oral, resp. rate 18, height $RemoveBe'6\' 5"'QbFuyILik$  (1.956 m), weight 142.883 kg (315 lb), SpO2 98.00%. Physical Exam  Constitutional: He is oriented to person, place, and time. He appears well-developed and  well-nourished. No distress.  HENT:  Head: Normocephalic and atraumatic.  Nose: Nose normal.  Mouth/Throat: Oropharynx is clear and moist.  Eyes: Conjunctivae and EOM are normal. Pupils are equal, round, and reactive to light.  Neck: Normal range of motion. Neck supple.  Cardiovascular: Normal rate and intact distal pulses.  An irregularly irregular rhythm present.  No murmur heard. Respiratory: Effort normal and breath sounds normal. No respiratory distress. He has no wheezes. He has no rales. He exhibits no tenderness.  GI: Soft. Bowel sounds are normal. He exhibits no distension. There is no tenderness.  Musculoskeletal:       Right hip: He exhibits decreased range of motion, tenderness and bony tenderness.  RLE- NVI, leg in external rotation at the hip, significant pain with any motion, no calf tenderness to palpation, abrasion to lateral right knee but no pain, dorsiflexion/plantarflexion ankle intact.  Secondary exam- bilat UE, LLE, cervical shows good ROM without pain, no tenderness noted.  Lymphadenopathy:    He has no cervical adenopathy.  Neurological: He is alert and oriented to person, place, and time. No cranial nerve deficit.  Skin: Skin is warm and dry. No rash noted. No erythema. No pallor.  Psychiatric: He has a normal mood and affect. His behavior is normal.    Assessment/Plan: Right intertrochanteric hip fracture  Risks and  benefits of ORIF R hip IM nail vs compression hip screw discussed and patient wishes to proceed.  He has been cleared for surgery by the medicine team and will plan on OR today.  He has been NPO after midnight last night.  Pain is currently controlled on pain medication.  He is NWB RLE on bedrest until surgery.   Adreyan Carbajal, [name] 09/04/2013, 8:08 AM

## 2013-09-04 NOTE — Brief Op Note (Signed)
09/03/2013 - 09/04/2013  2:19 PM  PATIENT:  Floy Sabina  63 y.o. male  PRE-OPERATIVE DIAGNOSIS:  hip fracture  POST-OPERATIVE DIAGNOSIS:  Right hip fracture   PROCEDURE:  Procedure(s): INTRAMEDULLARY (IM) NAIL INTERTROCHANTRIC (Right)  SURGEON:  Surgeon(s) and Role:    * W D Valeta Harms., MD - Primary  PHYSICIAN ASSISTANT: Chriss Czar, PA-C  ASSISTANTS:   ANESTHESIA:   general  EBL:  Total I/O In: 1000 [I.V.:1000] Out: 150 [Blood:150]  BLOOD ADMINISTERED:none  DRAINS: none   LOCAL MEDICATIONS USED:  NONE  SPECIMEN:  No Specimen  DISPOSITION OF SPECIMEN:  N/A  COUNTS:  YES  TOURNIQUET:  * No tourniquets in log *  DICTATION: .Other Dictation: Dictation Number unknown  PLAN OF CARE: Admit to inpatient   PATIENT DISPOSITION:  PACU - hemodynamically stable.   Delay start of Pharmacological VTE agent (>24hrs) due to surgical blood loss or risk of bleeding: yes

## 2013-09-04 NOTE — OR Nursing (Signed)
All documentation from 1324-1400 by Arlys John

## 2013-09-04 NOTE — Progress Notes (Signed)
Orthopedic Tech Progress Note Patient Details:  James Schaefer Jun 29, 1951 [id number] OHF applied to bed Patient ID: James Schaefer, male   DOB: 09-Oct-1950, 63 y.o.   MRN: [id number]   [name] 09/04/2013, 5:29 PM

## 2013-09-04 NOTE — Preoperative (Signed)
Beta Blockers   Reason not to administer Beta Blockers:Not Applicable 

## 2013-09-04 NOTE — Anesthesia Procedure Notes (Signed)
Procedure Name: Intubation Date/Time: 09/04/2013 12:11 PM Performed by: Raphael Gibney T Pre-anesthesia Checklist: Patient identified, Timeout performed, Emergency Drugs available, Suction available and Patient being monitored Patient Re-evaluated:Patient Re-evaluated prior to inductionOxygen Delivery Method: Circle system utilized and Simple face mask Preoxygenation: Pre-oxygenation with 100% oxygen Intubation Type: IV induction Ventilation: Mask ventilation with difficulty, Two handed mask ventilation required and Oral airway inserted - appropriate to patient size Laryngoscope Size: Mac and 4 Grade View: Grade II Tube type: Oral Tube size: 8.0 mm Number of attempts: 1 Airway Equipment and Method: Patient positioned with wedge pillow and Stylet Placement Confirmation: ETT inserted through vocal cords under direct vision,  positive ETCO2 and breath sounds checked- equal and bilateral Secured at: 23 cm Tube secured with: Tape Dental Injury: Teeth and Oropharynx as per pre-operative assessment

## 2013-09-04 NOTE — Progress Notes (Signed)
[facility] PROGRESS NOTE  James Schaefer:[id number] DOB: April 22, 1951 DOA: 09/03/2013 PCP: No primary provider on file.  Assessment/Plan: Principal Problem:  Hip fracture, intertrochanteric -Per orthopedics, going for surgery today -Continue pain management  Active Problems:  Atrial fibrillation  -Rate controlled on no meds, follow and resume aspirin when okay with ortho -Continue monitoring closely on telemetry postop, and further treat accordingly. HYPERTENSION  -Continue lisinopril -Holding off diuretics for now Hyperlipidemia -Continue statin   Code Status: Full Family Communication: Wife at bedside Disposition Plan: Pending orthopedic recommendations   Consultants:  Orthopedics  Procedures:  None  Antibiotics:  None  HPI/Subjective: Patient denies chest pain and no shortness of breath, awaiting surgery today  Objective: Filed Vitals:   09/04/13 1100  BP: 134/89  Pulse: 86  Temp: 98.8 F (37.1 C)  Resp: 18    Intake/Output Summary (Last 24 hours) at 09/04/13 1131 Last data filed at 09/04/13 0515  Gross per 24 hour  Intake      0 ml  Output    200 ml  Net   -200 ml   Filed Weights   09/03/13 1905  Weight: 142.883 kg (315 lb)    Exam:  General: alert & oriented x 3 In NAD Cardiovascular: Irregular irregular, rate controlled, nl S1 s2 Respiratory: CTAB Abdomen: soft +BS NT/ND, no masses palpable Extremities: No cyanosis and no edema    Data Reviewed: Basic Metabolic Panel:  Recent Labs Lab 09/03/13 1947 09/04/13 0535  NA 143 144  K 4.1 4.0  CL 106 106  CO2 27 26  GLUCOSE 99 121*  BUN 21 20  CREATININE 1.00 0.93  CALCIUM 9.1 9.0   Liver Function Tests:  Recent Labs Lab 09/04/13 0535  AST 18  ALT 18  ALKPHOS 57  BILITOT 0.7  PROT 6.3  ALBUMIN 3.4*   No results found for this basename: LIPASE, AMYLASE,  in the last 168 hours No results found for this basename: AMMONIA,  in the last 168 hours CBC:  Recent  Labs Lab 09/03/13 1947  WBC 7.6  NEUTROABS 5.9  HGB 15.2  HCT 43.3  MCV 93.7  PLT 137*   Cardiac Enzymes: No results found for this basename: CKTOTAL, CKMB, CKMBINDEX, TROPONINI,  in the last 168 hours BNP (last 3 results) No results found for this basename: PROBNP,  in the last 8760 hours CBG: No results found for this basename: GLUCAP,  in the last 168 hours  Recent Results (from the past 240 hour(s))  MRSA PCR SCREENING     Status: None   Collection Time    09/04/13 12:25 AM      Result Value Ref Range Status   MRSA by PCR NEGATIVE  NEGATIVE Final   Comment:            The GeneXpert MRSA Assay (FDA     approved for NASAL specimens     only), is one component of a     comprehensive MRSA colonization     surveillance program. It is not     intended to diagnose MRSA     infection nor to guide or     monitor treatment for     MRSA infections.     Studies: Dg Pelvis 1-2 Views  09/03/2013   CLINICAL DATA:  Patient bloating truck. [place] with is put stuck in a pallet. Right hip and leg pain.  EXAM: PELVIS - 1-2 VIEW  COMPARISON:  None.  FINDINGS: Right proximal femur fracture. Refer to the  right femur radiographs for further details.  No additional fractures. Hip joints are normally space and aligned. Bones are demineralized.  IMPRESSION: 1. Right proximal femur intertrochanteric fracture described in detail on the right femur radiographs. 2. No other fracture.  No dislocation.   Electronically Signed   By: Lajean Manes M.D.   On: 09/03/2013 21:21   Dg Femur Right  09/03/2013   CLINICAL DATA:  Fall with foot stuck in a pallete.  Right hip pain.  EXAM: RIGHT FEMUR - 2 VIEW  COMPARISON:  None.  FINDINGS: There is a fracture of the proximal femur. This is in trochanteric with evidence of a separate fracture line across the base of the femoral neck. Fracture is mildly displaced, approximately 14 mm. There is mild varus angulation.  No other fracture.  Hip joint is normally space and  aligned.  Right knee prosthesis appears well seated and aligned.  IMPRESSION: Intertrochanteric fracture of the proximal right femur as described.   Electronically Signed   By: Lajean Manes M.D.   On: 09/03/2013 21:20   Dg Chest Portable 1 View  09/03/2013   CLINICAL DATA:  Fall.  EXAM: PORTABLE CHEST - 1 VIEW  COMPARISON:  June 11, 2008.  FINDINGS: The heart size and mediastinal contours are within normal limits. Both lungs are clear. The visualized skeletal structures are unremarkable. Lateral portion of left lung base is not included in field of view.  IMPRESSION: No acute cardiopulmonary abnormality seen.   Electronically Signed   By: Sabino Dick M.D.   On: 09/03/2013 23:07    Scheduled Meds: . [MAR HOLD]  ceFAZolin (ANCEF) IV  3 g Intravenous On Call to OR  . [MAR HOLD] lisinopril  40 mg Oral QPM  . [MAR HOLD] simvastatin  20 mg Oral q1800  . [facility] HOLD] vitamin B-12  1,000 mcg Oral QPM   Continuous Infusions: . sodium chloride 20 mL/hr (09/03/13 2359)    Principal Problem:   Hip fracture, intertrochanteric Active Problems:   HYPERCHOLESTEROLEMIA   HYPERTENSION   Hip fracture    Time spent: [facility] Pager [phone]. If 7PM-7AM, please contact night-coverage at [email or web address], password [facility] 09/04/2013, 11:31 AM  LOS: 1 day

## 2013-09-04 NOTE — Progress Notes (Signed)
Utilization review completed.  

## 2013-09-04 NOTE — Transfer of Care (Signed)
Immediate Anesthesia Transfer of Care Note  Patient: James Schaefer  Procedure(s) Performed: Procedure(s): INTRAMEDULLARY (IM) NAIL INTERTROCHANTRIC (Right)  Patient Location: PACU  Anesthesia Type:General  Level of Consciousness: awake and alert   Airway & Oxygen Therapy: Patient Spontanous Breathing and Patient connected to face mask oxygen  Post-op Assessment: Report given to PACU RN and Post -op Vital signs reviewed and stable  Post vital signs: Reviewed and stable  Complications: No apparent anesthesia complications

## 2013-09-04 NOTE — Interval H&P Note (Signed)
History and Physical Interval Note:  09/04/2013 9:39 AM  James Schaefer  has presented today for surgery, with the diagnosis of hip fracture  The various methods of treatment have been discussed with the patient and family. After consideration of risks, benefits and other options for treatment, the patient has consented to  Procedure(s): INTRAMEDULLARY (IM) NAIL INTERTROCHANTRIC (Right) as a surgical intervention .  The patient's history has been reviewed, patient examined, no change in status, stable for surgery.  I have reviewed the patient's chart and labs.  Questions were answered to the patient's satisfaction.     Deyci Gesell JR,W D

## 2013-09-04 NOTE — Care Management Note (Signed)
CARE MANAGEMENT NOTE 09/04/2013  Patient:  James Schaefer, James Schaefer   Account Number:  [id number]  Date Initiated:  09/04/2013  Documentation initiated by:  Ricki Miller  Subjective/Objective Assessment:   63 yr old male s/pIM Nailing after a fall.     Action/Plan:   Patient is under worker's comp. CM left a message for [place] with [employer]-[phone] ext [phone]. CM will follow to arrange [facility] and DME.   Anticipated DC Date:     Anticipated DC Plan:           Choice offered to / List presented to:             Status of service:  In process, will continue to follow

## 2013-09-04 NOTE — Progress Notes (Signed)
Nutrition Brief Note  RD consult received for new hip fracture via protocol.    Wt Readings from Last 15 Encounters:  09/03/13 315 lb (142.883 kg)  09/03/13 315 lb (142.883 kg)  04/21/10 324 lb (146.965 kg)    Body mass index is 37.35 kg/(m^2). Patient meets criteria for Obesity class II based on current BMI.   Current diet order is NPO. Labs and medications reviewed.   Pt is currently in surgery. Pt is obese and appears to be without recent weight changes.  Will follow up after surgery for any issues.   [place], [place], [place] Pager [phone] After Hours Pager

## 2013-09-04 NOTE — H&P (View-Only) (Signed)
Reason for Consult:R hip fracture Referring Physician: ED  Kweku Stankey is an 63 y.o. male.  HPI: James Schaefer is a 63 y.o. male history of hypertension, hyperlipidemia, previous history of atrial fibrillation, previous history of hyperthyroidism and multinodular goiter status post radioactive iodine ablation had a fall today after tripping.  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 In the ER x-rays show right sided intertrochanteric fracture of the right hip.  History of bilat knee replacement by Dr. Ronnie Derby doing well with these.  Patient was admitted for further workup by medicine team. Patient denies any dizziness chest pain shortness of breath nausea vomiting abdominal pain focal deficits. Patient states that he tripped and fell did not hit his head or lose consciousness. In the ER EKG shows atrial fibrillation. Looking back to his chart he does have history of atrial fibrillation previously.    Past Medical History  Diagnosis Date  . Hypertension   . Hypercholesteremia     Past Surgical History  Procedure Laterality Date  . Knee replacements    . Hernia repair    . Penile pump      Family History  Problem Relation Age of Onset  . CAD Father   . Kidney failure Father   . Pancreatic cancer Maternal Uncle     Social History:  reports that he quit smoking about 35 years ago. He quit smokeless tobacco use about 35 years ago. He reports that he drinks alcohol. He reports that he does not use illicit drugs.  Allergies: No Known Allergies  Medications: I have reviewed the patient's current medications.  Results for orders placed during the hospital encounter of 09/03/13 (from the past 48 hour(s))  BASIC METABOLIC PANEL     Status: Abnormal   Collection Time    09/03/13  7:47 PM      Result Value Ref Range   Sodium 143  137 - 147 mEq/L   Potassium 4.1  3.7  - 5.3 mEq/L   Chloride 106  96 - 112 mEq/L   CO2 27  19 - 32 mEq/L   Glucose, Bld 99  70 - 99 mg/dL   BUN 21  6 - 23 mg/dL   Creatinine, Ser 1.00  0.50 - 1.35 mg/dL   Calcium 9.1  8.4 - 10.5 mg/dL   GFR calc non Af Amer 78 (*) >90 mL/min   GFR calc Af Amer >90  >90 mL/min   Comment: (NOTE)     The eGFR has been calculated using the CKD EPI equation.     This calculation has not been validated in all clinical situations.     eGFR's persistently <90 mL/min signify possible Chronic Kidney     Disease.  CBC WITH DIFFERENTIAL     Status: Abnormal   Collection Time    09/03/13  7:47 PM      Result Value Ref Range   WBC 7.6  4.0 - 10.5 K/uL   RBC 4.62  4.22 - 5.81 MIL/uL   Hemoglobin 15.2  13.0 - 17.0 g/dL   HCT 43.3  39.0 - 52.0 %   MCV 93.7  78.0 - 100.0 fL   MCH 32.9  26.0 - 34.0 pg   MCHC 35.1  30.0 - 36.0 g/dL   RDW 12.8  11.5 - 15.5 %   Platelets 137 (*) 150 - 400 K/uL  Neutrophils Relative % 77  43 - 77 %   Neutro Abs 5.9  1.7 - 7.7 K/uL   Lymphocytes Relative 11 (*) 12 - 46 %   Lymphs Abs 0.8  0.7 - 4.0 K/uL   Monocytes Relative 9  3 - 12 %   Monocytes Absolute 0.7  0.1 - 1.0 K/uL   Eosinophils Relative 3  0 - 5 %   Eosinophils Absolute 0.2  0.0 - 0.7 K/uL   Basophils Relative 0  0 - 1 %   Basophils Absolute 0.0  0.0 - 0.1 K/uL  PROTIME-INR     Status: None   Collection Time    09/03/13  7:47 PM      Result Value Ref Range   Prothrombin Time 13.1  11.6 - 15.2 seconds   INR 1.01  0.00 - 1.49  TYPE AND SCREEN     Status: None   Collection Time    09/03/13  7:47 PM      Result Value Ref Range   ABO/RH(D) O POS     Antibody Screen NEG     Sample Expiration 09/06/2013    URINALYSIS, ROUTINE W REFLEX MICROSCOPIC     Status: None   Collection Time    09/03/13 10:19 PM      Result Value Ref Range   Color, Urine YELLOW  YELLOW   APPearance CLEAR  CLEAR   Specific Gravity, Urine 1.029  1.005 - 1.030   pH 6.0  5.0 - 8.0   Glucose, UA NEGATIVE  NEGATIVE mg/dL   Hgb  urine dipstick NEGATIVE  NEGATIVE   Bilirubin Urine NEGATIVE  NEGATIVE   Ketones, ur NEGATIVE  NEGATIVE mg/dL   Protein, ur NEGATIVE  NEGATIVE mg/dL   Urobilinogen, UA 0.2  0.0 - 1.0 mg/dL   Nitrite NEGATIVE  NEGATIVE   Leukocytes, UA NEGATIVE  NEGATIVE   Comment: MICROSCOPIC NOT DONE ON URINES WITH NEGATIVE PROTEIN, BLOOD, LEUKOCYTES, NITRITE, OR GLUCOSE <1000 mg/dL.  MRSA PCR SCREENING     Status: None   Collection Time    09/04/13 12:25 AM      Result Value Ref Range   MRSA by PCR NEGATIVE  NEGATIVE   Comment:            The GeneXpert MRSA Assay (FDA     approved for NASAL specimens     only), is one component of a     comprehensive MRSA colonization     surveillance program. It is not     intended to diagnose MRSA     infection nor to guide or     monitor treatment for     MRSA infections.  PROTIME-INR     Status: None   Collection Time    09/04/13  5:35 AM      Result Value Ref Range   Prothrombin Time 13.7  11.6 - 15.2 seconds   INR 1.07  0.00 - 1.49  COMPREHENSIVE METABOLIC PANEL     Status: Abnormal   Collection Time    09/04/13  5:35 AM      Result Value Ref Range   Sodium 144  137 - 147 mEq/L   Potassium 4.0  3.7 - 5.3 mEq/L   Chloride 106  96 - 112 mEq/L   CO2 26  19 - 32 mEq/L   Glucose, Bld 121 (*) 70 - 99 mg/dL   BUN 20  6 - 23 mg/dL   Creatinine, Ser 0.93  0.50 - 1.35 mg/dL  Calcium 9.0  8.4 - 10.5 mg/dL   Total Protein 6.3  6.0 - 8.3 g/dL   Albumin 3.4 (*) 3.5 - 5.2 g/dL   AST 18  0 - 37 U/L   ALT 18  0 - 53 U/L   Alkaline Phosphatase 57  39 - 117 U/L   Total Bilirubin 0.7  0.3 - 1.2 mg/dL   GFR calc non Af Amer 88 (*) >90 mL/min   GFR calc Af Amer >90  >90 mL/min   Comment: (NOTE)     The eGFR has been calculated using the CKD EPI equation.     This calculation has not been validated in all clinical situations.     eGFR's persistently <90 mL/min signify possible Chronic Kidney     Disease.  APTT     Status: None   Collection Time    09/04/13   5:35 AM      Result Value Ref Range   aPTT 31  24 - 37 seconds    Dg Pelvis 1-2 Views  09/03/2013   CLINICAL DATA:  Patient bloating truck. [place] with is put stuck in a pallet. Right hip and leg pain.  EXAM: PELVIS - 1-2 VIEW  COMPARISON:  None.  FINDINGS: Right proximal femur fracture. Refer to the right femur radiographs for further details.  No additional fractures. Hip joints are normally space and aligned. Bones are demineralized.  IMPRESSION: 1. Right proximal femur intertrochanteric fracture described in detail on the right femur radiographs. 2. No other fracture.  No dislocation.   Electronically Signed   By: Lajean Manes M.D.   On: 09/03/2013 21:21   Dg Femur Right  09/03/2013   CLINICAL DATA:  Fall with foot stuck in a pallete.  Right hip pain.  EXAM: RIGHT FEMUR - 2 VIEW  COMPARISON:  None.  FINDINGS: There is a fracture of the proximal femur. This is in trochanteric with evidence of a separate fracture line across the base of the femoral neck. Fracture is mildly displaced, approximately 14 mm. There is mild varus angulation.  No other fracture.  Hip joint is normally space and aligned.  Right knee prosthesis appears well seated and aligned.  IMPRESSION: Intertrochanteric fracture of the proximal right femur as described.   Electronically Signed   By: Lajean Manes M.D.   On: 09/03/2013 21:20   Dg Chest Portable 1 View  09/03/2013   CLINICAL DATA:  Fall.  EXAM: PORTABLE CHEST - 1 VIEW  COMPARISON:  June 11, 2008.  FINDINGS: The heart size and mediastinal contours are within normal limits. Both lungs are clear. The visualized skeletal structures are unremarkable. Lateral portion of left lung base is not included in field of view.  IMPRESSION: No acute cardiopulmonary abnormality seen.   Electronically Signed   By: Sabino Dick M.D.   On: 09/03/2013 23:07    Review of Systems  Constitutional: Negative.   HENT: Negative.   Eyes: Negative.   Respiratory: Negative.   Cardiovascular:  Negative.   Gastrointestinal: Negative.   Genitourinary: Negative.   Musculoskeletal: Positive for falls and joint pain. Negative for back pain and neck pain.  Skin: Negative.   Neurological: Negative.   Endo/Heme/Allergies: Negative.   Psychiatric/Behavioral: Negative.    Blood pressure 127/76, pulse 85, temperature 98.1 F (36.7 C), temperature source Oral, resp. rate 18, height $RemoveBe'6\' 5"'bWNwgqKBh$  (1.956 m), weight 142.883 kg (315 lb), SpO2 98.00%. Physical Exam  Constitutional: He is oriented to person, place, and time. He appears well-developed and  well-nourished. No distress.  HENT:  Head: Normocephalic and atraumatic.  Nose: Nose normal.  Mouth/Throat: Oropharynx is clear and moist.  Eyes: Conjunctivae and EOM are normal. Pupils are equal, round, and reactive to light.  Neck: Normal range of motion. Neck supple.  Cardiovascular: Normal rate and intact distal pulses.  An irregularly irregular rhythm present.  No murmur heard. Respiratory: Effort normal and breath sounds normal. No respiratory distress. He has no wheezes. He has no rales. He exhibits no tenderness.  GI: Soft. Bowel sounds are normal. He exhibits no distension. There is no tenderness.  Musculoskeletal:       Right hip: He exhibits decreased range of motion, tenderness and bony tenderness.  RLE- NVI, leg in external rotation at the hip, significant pain with any motion, no calf tenderness to palpation, abrasion to lateral right knee but no pain, dorsiflexion/plantarflexion ankle intact.  Secondary exam- bilat UE, LLE, cervical shows good ROM without pain, no tenderness noted.  Lymphadenopathy:    He has no cervical adenopathy.  Neurological: He is alert and oriented to person, place, and time. No cranial nerve deficit.  Skin: Skin is warm and dry. No rash noted. No erythema. No pallor.  Psychiatric: He has a normal mood and affect. His behavior is normal.    Assessment/Plan: Right intertrochanteric hip fracture  Risks and  benefits of ORIF R hip IM nail vs compression hip screw discussed and patient wishes to proceed.  He has been cleared for surgery by the medicine team and will plan on OR today.  He has been NPO after midnight last night.  Pain is currently controlled on pain medication.  He is NWB RLE on bedrest until surgery.   Sabrina Arriaga, [name] 09/04/2013, 8:08 AM

## 2013-09-04 NOTE — Anesthesia Preprocedure Evaluation (Signed)
Anesthesia Evaluation  Patient identified by MRN, date of birth, ID band Patient awake    Reviewed: Allergy & Precautions, H&P , NPO status , Patient's Chart, lab work & pertinent test results  Airway Mallampati: II  Neck ROM: full    Dental   Pulmonary former smoker,          Cardiovascular hypertension,     Neuro/Psych    GI/Hepatic   Endo/Other  Hyperthyroidism obese  Renal/GU      Musculoskeletal   Abdominal   Peds  Hematology   Anesthesia Other Findings   Reproductive/Obstetrics                           Anesthesia Physical Anesthesia Plan  ASA: II  Anesthesia Plan: General   Post-op Pain Management:    Induction: Intravenous  Airway Management Planned: Oral ETT  Additional Equipment:   Intra-op Plan:   Post-operative Plan: Extubation in OR  Informed Consent: I have reviewed the patients History and Physical, chart, labs and discussed the procedure including the risks, benefits and alternatives for the proposed anesthesia with the patient or authorized representative who has indicated his/her understanding and acceptance.     Plan Discussed with: CRNA, Anesthesiologist and Surgeon  Anesthesia Plan Comments:         Anesthesia Quick Evaluation

## 2013-09-05 ENCOUNTER — Inpatient Hospital Stay (HOSPITAL_COMMUNITY): Payer: Worker's Compensation

## 2013-09-05 LAB — BASIC METABOLIC PANEL
BUN: 15 mg/dL (ref 6–23)
CO2: 25 meq/L (ref 19–32)
CREATININE: 0.79 mg/dL (ref 0.50–1.35)
Calcium: 8.5 mg/dL (ref 8.4–10.5)
Chloride: 101 mEq/L (ref 96–112)
GFR calc Af Amer: >90 mL/min (ref >90)
GFR calc non Af Amer: >90 mL/min (ref >90)
Glucose, Bld: 123 mg/dL — ABNORMAL HIGH (ref 70–99)
Potassium: 4.1 mEq/L (ref 3.7–5.3)
Sodium: 138 mEq/L (ref 137–147)

## 2013-09-05 LAB — CBC
HEMATOCRIT: 38.4 % — AB (ref 39.0–52.0)
Hemoglobin: 13.1 g/dL (ref 13.0–17.0)
MCH: 32.8 pg (ref 26.0–34.0)
MCHC: 34.1 g/dL (ref 30.0–36.0)
MCV: 96 fL (ref 78.0–100.0)
Platelets: DECREASED 10*3/uL (ref 150–400)
RBC: 4 MIL/uL — AB (ref 4.22–5.81)
RDW: 12.9 % (ref 11.5–15.5)
WBC: 10.7 10*3/uL — ABNORMAL HIGH (ref 4.0–10.5)

## 2013-09-05 LAB — URINE CULTURE

## 2013-09-05 MED ORDER — WARFARIN SODIUM 7.5 MG PO TABS
7.5000 mg | ORAL_TABLET | Freq: Once | ORAL | Status: AC
  Administered 2013-09-05: 7.5 mg via ORAL
  Filled 2013-09-05 (×2): qty 1

## 2013-09-05 MED ORDER — WARFARIN VIDEO: Freq: Once | Status: DC

## 2013-09-05 MED ORDER — WARFARIN - PHARMACIST DOSING INPATIENT: Freq: Every day | Status: DC

## 2013-09-05 MED ORDER — MAGNESIUM HYDROXIDE 400 MG/5ML PO SUSP
30.0000 mL | Freq: Every day | ORAL | Status: DC | PRN
  Administered 2013-09-05: 30 mL via ORAL
  Filled 2013-09-05: qty 30

## 2013-09-05 MED ORDER — COUMADIN BOOK
Freq: Once | Status: AC
  Administered 2013-09-05: 11:00:00
  Filled 2013-09-05: qty 1

## 2013-09-05 MED ORDER — ONDANSETRON HCL 4 MG/2ML IJ SOLN
4.0000 mg | Freq: Four times a day (QID) | INTRAMUSCULAR | Status: DC | PRN
  Administered 2013-09-05 – 2013-09-07 (×2): 4 mg via INTRAVENOUS
  Filled 2013-09-05 (×2): qty 2

## 2013-09-05 MED ORDER — BISACODYL 5 MG PO TBEC
10.0000 mg | DELAYED_RELEASE_TABLET | Freq: Every day | ORAL | Status: DC | PRN
  Administered 2013-09-05: 10 mg via ORAL
  Filled 2013-09-05: qty 2

## 2013-09-05 NOTE — Progress Notes (Signed)
Subjective: 1 Day Post-Op Procedure(s) (LRB): INTRAMEDULLARY (IM) NAIL INTERTROCHANTRIC (Right) Patient reports pain as mild and moderate.    Objective: Vital signs in last 24 hours: Temp:  [97.6 F (36.4 C)-99.1 F (37.3 C)] 99.1 F (37.3 C) (03/10 0998) Pulse Rate:  [81-96] 96 (03/10 0638) Resp:  [12-20] 16 (03/10 3382) BP: (110-163)/(71-94) 126/74 mmHg (03/10 0638) SpO2:  [94 %-99 %] 98 % (03/10 5053)  Intake/Output from previous day: 03/09 0701 - 03/10 0700 In: 1990 [P.O.:360; I.V.:1630] Out: 1125 [Urine:975; Blood:150] Intake/Output this shift:     Recent Labs  09/03/13 1947 09/05/13 0413  HGB 15.2 13.1    Recent Labs  09/03/13 1947 09/05/13 0413  WBC 7.6 10.7*  RBC 4.62 4.00*  HCT 43.3 38.4*  PLT 137* PLATELET CLUMPS NOTED ON SMEAR, COUNT APPEARS DECREASED    Recent Labs  09/04/13 0535 09/05/13 0413  NA 144 138  K 4.0 4.1  CL 106 101  CO2 26 25  BUN 20 15  CREATININE 0.93 0.79  GLUCOSE 121* 123*  CALCIUM 9.0 8.5    Recent Labs  09/03/13 1947 09/04/13 0535  INR 1.01 1.07    Neurovascular intact Sensation intact distally Intact pulses distally Dorsiflexion/Plantar flexion intact Incision: dressing C/D/I No cellulitis present Compartment soft  Assessment/Plan: 1 Day Post-Op Procedure(s) (LRB): INTRAMEDULLARY (IM) NAIL INTERTROCHANTRIC (Right) Up with therapy Discharge home with home health possibly as early as tomorrow pending how PT goes today Partial 50% Weight Bearing RLE with walker Dressing change tomorrow DVT prophylaxis received Lovenox dose this AM, platelet count low will consult pharm for coumadin, SCDs, compression stockings Pain control po oxycodone breakthrough dilaudid Dressing change tomorrow   Chriss Czar 09/05/2013, 8:17 AM

## 2013-09-05 NOTE — ED Provider Notes (Signed)
Medical screening examination/treatment/procedure(s) were performed by non-physician practitioner and as supervising physician I was immediately available for consultation/collaboration.   EKG Interpretation None        Delice Bison Dujuan Stankowski, DO 09/05/13 0013

## 2013-09-05 NOTE — Progress Notes (Signed)
ANTICOAGULATION CONSULT NOTE - Initial Consult  Pharmacy Consult for Coumadin Indication: VTE prophylaxis  No Known Allergies  Patient Measurements: Height: 6\' 5"  (195.6 cm) Weight: 315 lb (142.883 kg) IBW/kg (Calculated) : 89.1   Vital Signs: Temp: 99.1 F (37.3 C) (03/10 0638) Temp src: Oral (03/10 0638) BP: 126/74 mmHg (03/10 0638) Pulse Rate: 96 (03/10 0638)  Labs:  Recent Labs  09/03/13 1947 09/04/13 0535 09/05/13 0413  HGB 15.2  --  13.1  HCT 43.3  --  38.4*  PLT 137*  --  PLATELET CLUMPS NOTED ON SMEAR, COUNT APPEARS DECREASED  APTT  --  31  --   LABPROT 13.1 13.7  --   INR 1.01 1.07  --   CREATININE 1.00 0.93 0.79    Estimated Creatinine Clearance: 147.9 ml/min (by C-G formula based on Cr of 0.79).   Medical History: Past Medical History  Diagnosis Date  . Hypertension   . Hypercholesteremia     Medications:  Prescriptions prior to admission  Medication Sig Dispense Refill  . aspirin EC 81 MG tablet Take 81 mg by mouth every evening.      . hydrochlorothiazide (HYDRODIURIL) 25 MG tablet Take 25 mg by mouth every morning.      Marland Kitchen lisinopril (PRINIVIL,ZESTRIL) 40 MG tablet Take 40 mg by mouth every evening.      . Multiple Vitamins-Minerals (MULTIVITAMIN WITH MINERALS) tablet Take 1 tablet by mouth every evening.      . pravastatin (PRAVACHOL) 40 MG tablet Take 40 mg by mouth every evening.      . vitamin B-12 (CYANOCOBALAMIN) 1000 MCG tablet Take 1,000 mcg by mouth every evening.      . vitamin E 400 UNIT capsule Take 400 Units by mouth every evening.       Scheduled:  . docusate sodium  100 mg Oral BID  . lisinopril  40 mg Oral QPM  . simvastatin  20 mg Oral q1800  . vitamin B-12  1,000 mcg Oral QPM   Infusions:  . sodium chloride 20 mL/hr (09/03/13 2359)  . lactated ringers 50 mL/hr at 09/04/13 2321    Assessment: 63 y.o male admitted 09/03/13 PM with right hip fracture. Today is POD#1 s/p IM nail right. For DVT prophylaxis he received  Lovenox dose this AM. Platelet count low thus orthopedic MD/PA discontinued the lovenox and consulted pharmacy for coumadin. Also ordered SCDs, compression stockings. Ortho notes possible discharge tomorrow. Patient will need home health nurse/pharmacy to draw INR & monitor coumadin outpatient. Baseline INR 1.07, H/H 15.2/43.3,  PLTC 137 on admit 09/03/13. No bleeding noted.  H/o Atrial fibrillation on aspirin 81 mg daily PTA.   Warfarin point score =6 points   Goal of Therapy:  INR 2-3 Monitor platelets by anticoagulation protocol: Yes   Plan:  Coumadin 7.5 mg po x1 today Daily Protime/INR Educate patient prior to discharge.  Coumadin book & video ordered.  Nicole Cella, RPh Clinical Pharmacist Pager: [phone] 09/05/2013,10:01 AM

## 2013-09-05 NOTE — Care Management Note (Signed)
CARE MANAGEMENT NOTE 09/05/2013  Patient:  James Schaefer, James Schaefer   Account Number:  [id number]  Date Initiated:  09/04/2013  Documentation initiated by:  Ricki Miller  Subjective/Objective Assessment:   63 yr old male s/pIM Nailing after a fall.     Action/Plan:   Patient is under worker's comp. CM left a message for [place] with [employer]-[phone] ext [phone]. CM will follow to arrange [facility] and DME.   Anticipated DC Date:     Anticipated DC Plan:  [place]         Choice offered to / List presented to:             Status of service:  In process, will continue to follow Medicare Important Message given?   (If response is "NO", the following Medicare IM given date fields will be blank) Date Medicare IM given:   Date Additional Medicare IM given:    Discharge Disposition:    Per UR Regulation:    If discussed at Long Length of Stay Meetings, dates discussed:    Comments:  09/05/13 [place], RN BSN Case Manager [phone] Case Manager received call from Yolanda Bonine with [employer], states that a company named [employer] will arrange for home health and DME. CM will follow up with them. Contact number [phone]

## 2013-09-05 NOTE — Evaluation (Signed)
Physical Therapy Evaluation Patient Details Name: Oakes Mccready MRN: [id number] DOB: 01/05/1951 Today's Date: 09/05/2013 Time: 1540-0867 PT Time Calculation (min): 19 min  PT Assessment / Plan / Recommendation History of Present Illness  Pt is a 63 y/o male admitted s/p trip and fall at work, sustaining an intertrochanteric femur fracture. Pt is now s/p IM nailing and is PWB 50%.   Clinical Impression  This patient presents with acute pain and decreased functional independence following the above mentioned procedure. At the time of PT eval, pt was able to perform transfers with moderate assist, often with +2. Pt with increased abdominal pain, reportedly feels like gas pains. Pt left on [facility] with NT aware of functional ability. This patient is appropriate for skilled PT interventions to address functional limitations, improve safety and independence with functional mobility, and return to PLOF.     PT Assessment  Patient needs continued PT services    Follow Up Recommendations  Home health PT;Supervision for mobility/OOB    Does the patient have the potential to tolerate intense rehabilitation      Barriers to Discharge        Equipment Recommendations  Rolling walker with 5" wheels;3in1 (PT) (wide width)    Recommendations for Other Services     Frequency Min 5X/week    Precautions / Restrictions Precautions Precautions: Fall Restrictions Weight Bearing Restrictions: Yes RLE Weight Bearing: Partial weight bearing RLE Partial Weight Bearing Percentage or Pounds: 50%   Pertinent Vitals/Pain Pt reports moderate pain throughout session. States he had a pain pill prior to session beginning.       Mobility  Bed Mobility Overal bed mobility: Needs Assistance Bed Mobility: Supine to Sit Supine to sit: Min assist;Mod assist;+2 for physical assistance;+2 for safety/equipment General bed mobility comments: Pt able to transition to EOB with support of bed rails and trapeze, as well  as assist for trunk stability, and scooting pt to EOB with bed pad.  Transfers Overall transfer level: Needs assistance Equipment used: Rolling walker (2 wheeled) Transfers: Sit to/from [facility] Sit to Stand: Mod assist;+2 physical assistance Stand pivot transfers: Mod assist General transfer comment: VC's for hand placement on seated surface for safety. Assist for power-up to full stand, with increased time needed to achieve trunk extension and RLE placement on floor. Mod assist to pivot around to chair and then to [facility] (2 separate transfers), especially for advancement of RLE and cues for posture.     Exercises General Exercises - Lower Extremity Ankle Circles/Pumps: 10 reps Quad Sets: 10 reps   PT Diagnosis: Difficulty walking;Acute pain  PT Problem List: Decreased strength;Decreased range of motion;Decreased activity tolerance;Decreased balance;Decreased mobility;Decreased knowledge of use of DME;Decreased safety awareness;Decreased knowledge of precautions;Pain PT Treatment Interventions: DME instruction;Gait training;Stair training;Functional mobility training;Therapeutic activities;Therapeutic exercise;Neuromuscular re-education;Patient/family education     PT Goals(Current goals can be found in the care plan section) Acute Rehab PT Goals Patient Stated Goal: To return to PLOF PT Goal Formulation: With patient/family Time For Goal Achievement: 09/12/13 Potential to Achieve Goals: Good  Visit Information  Last PT Received On: 09/05/13 Assistance Needed: +2 (safety) History of Present Illness: Pt is a 63 y/o male admitted s/p trip and fall at work, sustaining an intertrochanteric femur fracture. Pt is now s/p IM nailing and is PWB 50%.        Prior [place] expects to be discharged to:: Private residence Living Arrangements: Spouse/significant other Available Help at Discharge: Family;Available 24 hours/day Type of Home:  House Home Access: Stairs to enter [employer] of Steps: 5 Entrance Stairs-Rails: Left Home Layout: One level Home Equipment: Cane - single point;Bedside commode;Walker - 2 wheels;Shower seat Prior Function Level of Independence: Independent Comments: Fall happened at work Communication Communication: No difficulties Dominant Hand: Left    Cognition  Cognition Arousal/Alertness: Awake/alert Behavior During Therapy: WFL for tasks assessed/performed Overall Cognitive Status: Within Functional Limits for tasks assessed    Extremity/Trunk Assessment Upper Extremity Assessment Upper Extremity Assessment: Overall WFL for tasks assessed Lower Extremity Assessment Lower Extremity Assessment: RLE deficits/detail RLE Deficits / Details: Decreased strength and AROM consistent with IM nailing of femur.  RLE: Unable to fully assess due to pain Cervical / Trunk Assessment Cervical / Trunk Assessment: Normal   Balance Balance Overall balance assessment: Needs assistance Sitting-balance support: Feet supported Sitting balance-Leahy Scale: Good Standing balance support: Bilateral upper extremity supported Standing balance-Leahy Scale: Poor  End of Session PT - End of Session Equipment Utilized During Treatment: Gait belt Activity Tolerance: Patient limited by pain;Patient limited by fatigue Patient left: with call bell/phone within reach;with family/visitor present (On BSC - NT aware) Nurse Communication: Mobility status  GP     Jolyn Lent 09/05/2013, 12:14 PM  Jolyn Lent, PT, DPT Acute Rehabilitation Services Pager: [phone]

## 2013-09-05 NOTE — Progress Notes (Signed)
Shift event: RN paged NP stating pt had vomited twice, abd was distended with only faint bowel sounds. No abd pain. Pt is POD 1 of hip fx repair and am concerned about ileus/obstruction. Abd film obtained stat which showed findings consistent with ileus and possible early obstruction. Place pt NPO, hold po meds for now, NGT to low intermittent suction. If vomits again after NGT placed, will call surgery. Clance Boll, NP [facility]

## 2013-09-05 NOTE — Op Note (Signed)
NAME:  WESTLEY, BLASS NO.:  [id number]  MEDICAL RECORD NO.:  [id number]  LOCATION:  5N25C                        FACILITY:  [facility]  PHYSICIAN:  Lockie Pares, M.D.    DATE OF BIRTH:  [birth year]  DATE OF PROCEDURE:  09/04/2013 DATE OF DISCHARGE:                              OPERATIVE REPORT   INDICATIONS:  A 63 year old, on the job injury to his right hip, intertrochanteric with basilar neck component, fracture of the hip, thought to be amenable to surgery.  PREOPERATIVE DIAGNOSIS:  Comminuted 3-part intertrochanteric hip fracture.  POSTOPERATIVE DIAGNOSIS:  Comminuted 3-part intertrochanteric hip fracture.  OPERATION:  Open reduction and internal fixation with Biomet trochanteric nail (11 x 380 mm nail with 120 mm compression screw and derotation screw).  SURGEON:  Lockie Pares, M.D.  ASSISTANT:  Judith Part. Chabon, P.A.  ESTIMATED BLOOD LOSS:  Approximately 300 mL.  DESCRIPTION OF PROCEDURE:  Reduction of the fracture with traction, adduction of the leg with internal rotation of the foot.  Incision was made over the proximal to greater trochanter, entry point on the trochanter was identified.  We placed a guide pin down into the proximal femur, confirmed this position in AP and lateral plane, then placed a longer tip guide pin down to the level of the knee.  We then used a reamer to create an entry point for the proximal anatomy of the nail with a large reamer.  Confirmed this position with AP and lateral plane. We then reamed the isthmus to 13 to accept the 11 mm diameter Mayo.  We then inserted the nail in the appropriate degree of rotation.  The reduction was confirmed prior to this with the AP and lateral of the hip.  We confirmed the nail to be well within the confines of the femur as well.  Then, we placed a compression screw through the nail into the femoral neck in the inferomedial aspect of the neck 120 mm in length, locked into the nail  after exerting compression with the nail.  We then placed a derotation screw superior to this and confirmed its position with AP and lateral plane.  Stab incisions were closed with staples, and we closed the fashion which was split over the greater trochanter with interrupted Ethibond followed by Vicryl and skin clips on the skin. Lightly compressive sterile dressing applied.  Taken to recovery room in stable condition.    Lockie Pares, M.D.    WDC/MEDQ  D:  09/04/2013  T:  09/05/2013  Job:  [id number]

## 2013-09-05 NOTE — Progress Notes (Signed)
Patient stated that he did not know he had A-fib, nor what it means. RN gave patient and family pre-printed education on Atrial Fibrillation from [facility] Patient Education file.

## 2013-09-05 NOTE — Progress Notes (Signed)
[facility] PROGRESS NOTE  James Schaefer BTY:[id number] DOB: 02/04/1951 DOA: 09/03/2013 PCP: No primary provider on file.  Assessment/Plan: Principal Problem:  Hip fracture, intertrochanteric -Per orthopedics, s/p surgery 3/9 -Continue pain management  -ortho anticipating possible d/c home in am depending on how he does with  PT today -started on lovenox/coumadin per ortho for DVT prophylaxis post hip. Noted pt was on ASA only prior to admit for afib. Active Problems:  Atrial fibrillation  -Rate controlled on no meds -will resume aspirin his preadmit Asa 81mg  -Continue monitoring closely on telemetry postop, and further treat accordingly. HYPERTENSION  -Continue lisinopril -Holding off diuretics for now Hyperlipidemia -Continue statin   Code Status: Full Family Communication: Wife at bedside Disposition Plan: likely home when ok per orthopedics    Consultants:  Orthopedics  Procedures:  None  Antibiotics:  None  HPI/Subjective: Sitting up in chair, c/o hip pain. Denies CP, no sob  Objective: Filed Vitals:   09/05/13 1600  BP:   Pulse:   Temp:   Resp: 18    Intake/Output Summary (Last 24 hours) at 09/05/13 1821 Last data filed at 09/05/13 1300  Gross per 24 hour  Intake   1680 ml  Output    875 ml  Net    805 ml   Filed Weights   09/03/13 1905  Weight: 142.883 kg (315 lb)    Exam:  General: alert & oriented x 3 In NAD Cardiovascular: Irregular irregular, rate controlled, nl S1 s2 Respiratory: CTAB Abdomen: soft +BS NT/ND, no masses palpable Extremities: No cyanosis and no edema    Data Reviewed: Basic Metabolic Panel:  Recent Labs Lab 09/03/13 1947 09/04/13 0535 09/05/13 0413  NA 143 144 138  K 4.1 4.0 4.1  CL 106 106 101  CO2 27 26 25   GLUCOSE 99 121* 123*  BUN 21 20 15   CREATININE 1.00 0.93 0.79  CALCIUM 9.1 9.0 8.5   Liver Function Tests:  Recent Labs Lab 09/04/13 0535  AST 18  ALT 18  ALKPHOS 57  BILITOT 0.7   PROT 6.3  ALBUMIN 3.4*   No results found for this basename: LIPASE, AMYLASE,  in the last 168 hours No results found for this basename: AMMONIA,  in the last 168 hours CBC:  Recent Labs Lab 09/03/13 1947 09/05/13 0413  WBC 7.6 10.7*  NEUTROABS 5.9  --   HGB 15.2 13.1  HCT 43.3 38.4*  MCV 93.7 96.0  PLT 137* PLATELET CLUMPS NOTED ON SMEAR, COUNT APPEARS DECREASED   Cardiac Enzymes: No results found for this basename: CKTOTAL, CKMB, CKMBINDEX, TROPONINI,  in the last 168 hours BNP (last 3 results) No results found for this basename: PROBNP,  in the last 8760 hours CBG: No results found for this basename: GLUCAP,  in the last 168 hours  Recent Results (from the past 240 hour(s))  URINE CULTURE     Status: None   Collection Time    09/03/13 10:19 PM      Result Value Ref Range Status   Specimen Description URINE, CLEAN CATCH   Final   Special Requests NONE   Final   Culture  Setup Time     Final   Value: 09/04/2013 11:43     Performed at [facility]     Final   Value: 8,000 COLONIES/ML     Performed at [facility]   Culture     Final   Value: INSIGNIFICANT GROWTH     Performed  at [facility]   Report Status 09/05/2013 FINAL   Final  MRSA PCR SCREENING     Status: None   Collection Time    09/04/13 12:25 AM      Result Value Ref Range Status   MRSA by PCR NEGATIVE  NEGATIVE Final   Comment:            The GeneXpert MRSA Assay (FDA     approved for NASAL specimens     only), is one component of a     comprehensive MRSA colonization     surveillance program. It is not     intended to diagnose MRSA     infection nor to guide or     monitor treatment for     MRSA infections.     Studies: Dg Pelvis 1-2 Views  09/03/2013   CLINICAL DATA:  Patient bloating truck. [place] with is put stuck in a pallet. Right hip and leg pain.  EXAM: PELVIS - 1-2 VIEW  COMPARISON:  None.  FINDINGS: Right proximal femur fracture. Refer to the  right femur radiographs for further details.  No additional fractures. Hip joints are normally space and aligned. Bones are demineralized.  IMPRESSION: 1. Right proximal femur intertrochanteric fracture described in detail on the right femur radiographs. 2. No other fracture.  No dislocation.   Electronically Signed   By: Lajean Manes M.D.   On: 09/03/2013 21:21   Dg Hip Operative Right  09/04/2013   CLINICAL DATA:  Right hip fracture.  EXAM: DG OPERATIVE RIGHT HIP  TECHNIQUE: Two spot fluoroscopic AP images of the right hip are submitted.  COMPARISON:  Radiographs dated 09/03/2013  FINDINGS: AP and lateral views of the right hip demonstrate the patient has undergone open reduction and internal fixation of the intertrochanteric fracture of the proximal right femur. Intra medullary rod and 2 compression screws are in place in good position.  IMPRESSION: Open reduction and internal fixation of intertrochanteric fracture of the proximal right femur.   Electronically Signed   By: Rozetta Nunnery M.D.   On: 09/04/2013 15:07   Dg Femur Right  09/03/2013   CLINICAL DATA:  Fall with foot stuck in a pallete.  Right hip pain.  EXAM: RIGHT FEMUR - 2 VIEW  COMPARISON:  None.  FINDINGS: There is a fracture of the proximal femur. This is in trochanteric with evidence of a separate fracture line across the base of the femoral neck. Fracture is mildly displaced, approximately 14 mm. There is mild varus angulation.  No other fracture.  Hip joint is normally space and aligned.  Right knee prosthesis appears well seated and aligned.  IMPRESSION: Intertrochanteric fracture of the proximal right femur as described.   Electronically Signed   By: Lajean Manes M.D.   On: 09/03/2013 21:20   Dg Chest Portable 1 View  09/03/2013   CLINICAL DATA:  Fall.  EXAM: PORTABLE CHEST - 1 VIEW  COMPARISON:  June 11, 2008.  FINDINGS: The heart size and mediastinal contours are within normal limits. Both lungs are clear. The visualized skeletal  structures are unremarkable. Lateral portion of left lung base is not included in field of view.  IMPRESSION: No acute cardiopulmonary abnormality seen.   Electronically Signed   By: Sabino Dick M.D.   On: 09/03/2013 23:07    Scheduled Meds: . docusate sodium  100 mg Oral BID  . lisinopril  40 mg Oral QPM  . simvastatin  20 mg Oral q1800  .  vitamin B-12  1,000 mcg Oral QPM  . warfarin  7.5 mg Oral ONCE-1800  . warfarin   Does not apply Once  . Warfarin - Pharmacist Dosing Inpatient   Does not apply q1800   Continuous Infusions: . sodium chloride 20 mL/hr (09/03/13 2359)  . lactated ringers 50 mL/hr at 09/04/13 2321    Principal Problem:   Hip fracture, intertrochanteric Active Problems:   HYPERCHOLESTEROLEMIA   HYPERTENSION   Hip fracture    Time spent: [facility] Pager [phone]. If 7PM-7AM, please contact night-coverage at [email or web address], password [facility] 09/05/2013, 6:21 PM  LOS: 2 days

## 2013-09-05 NOTE — Anesthesia Postprocedure Evaluation (Signed)
Anesthesia Post Note  Patient: James Schaefer  Procedure(s) Performed: Procedure(s) (LRB): INTRAMEDULLARY (IM) NAIL INTERTROCHANTRIC (Right)  Anesthesia type: General  Patient location: PACU  Post pain: Pain level controlled and Adequate analgesia  Post assessment: Post-op Vital signs reviewed, Patient's Cardiovascular Status Stable, Respiratory Function Stable, Patent Airway and Pain level controlled  Last Vitals:  Filed Vitals:   09/05/13 0800  BP:   Pulse:   Temp:   Resp: 16    Post vital signs: Reviewed and stable  Level of consciousness: awake, alert  and oriented  Complications: No apparent anesthesia complications

## 2013-09-06 DIAGNOSIS — K9189 Other postprocedural complications and disorders of digestive system: Secondary | ICD-10-CM

## 2013-09-06 DIAGNOSIS — K929 Disease of digestive system, unspecified: Secondary | ICD-10-CM

## 2013-09-06 DIAGNOSIS — K560 Paralytic ileus: Secondary | ICD-10-CM

## 2013-09-06 DIAGNOSIS — K567 Ileus, unspecified: Secondary | ICD-10-CM | POA: Diagnosis not present

## 2013-09-06 LAB — BASIC METABOLIC PANEL
BUN: 22 mg/dL (ref 6–23)
CALCIUM: 9.1 mg/dL (ref 8.4–10.5)
CO2: 27 mEq/L (ref 19–32)
Chloride: 97 mEq/L (ref 96–112)
Creatinine, Ser: 0.84 mg/dL (ref 0.50–1.35)
GFR calc Af Amer: >90 mL/min (ref >90)
Glucose, Bld: 140 mg/dL — ABNORMAL HIGH (ref 70–99)
Potassium: 3.9 mEq/L (ref 3.7–5.3)
Sodium: 137 mEq/L (ref 137–147)

## 2013-09-06 LAB — CBC
HEMATOCRIT: 37.8 % — AB (ref 39.0–52.0)
Hemoglobin: 13 g/dL (ref 13.0–17.0)
MCH: 32.3 pg (ref 26.0–34.0)
MCHC: 34.4 g/dL (ref 30.0–36.0)
MCV: 94 fL (ref 78.0–100.0)
Platelets: 83 10*3/uL — ABNORMAL LOW (ref 150–400)
RBC: 4.02 MIL/uL — ABNORMAL LOW (ref 4.22–5.81)
RDW: 12.8 % (ref 11.5–15.5)
WBC: 12.8 10*3/uL — ABNORMAL HIGH (ref 4.0–10.5)

## 2013-09-06 LAB — PROTIME-INR
INR: 1.2 (ref 0.00–1.49)
Prothrombin Time: 14.9 seconds (ref 11.6–15.2)

## 2013-09-06 MED ORDER — BISACODYL 10 MG RE SUPP
10.0000 mg | Freq: Every day | RECTAL | Status: AC
  Administered 2013-09-06 – 2013-09-07 (×2): 10 mg via RECTAL
  Filled 2013-09-06 (×2): qty 1

## 2013-09-06 MED ORDER — METOCLOPRAMIDE HCL 5 MG PO TABS
5.0000 mg | ORAL_TABLET | Freq: Four times a day (QID) | ORAL | Status: DC
  Administered 2013-09-06 – 2013-09-07 (×3): 5 mg via ORAL
  Filled 2013-09-06 (×10): qty 1

## 2013-09-06 MED ORDER — BISACODYL 5 MG PO TBEC: 10.0000 mg | DELAYED_RELEASE_TABLET | Freq: Every day | ORAL | Status: DC

## 2013-09-06 MED ORDER — WARFARIN SODIUM 5 MG PO TABS
5.0000 mg | ORAL_TABLET | Freq: Once | ORAL | Status: DC
  Filled 2013-09-06: qty 1

## 2013-09-06 MED ORDER — BISACODYL 5 MG PO TBEC
10.0000 mg | DELAYED_RELEASE_TABLET | Freq: Every day | ORAL | Status: DC
  Administered 2013-09-06: 10 mg via ORAL
  Filled 2013-09-06: qty 2

## 2013-09-06 MED ORDER — WARFARIN SODIUM 2.5 MG PO TABS
2.5000 mg | ORAL_TABLET | Freq: Once | ORAL | Status: AC
Start: 2013-09-06 — End: 2013-09-06
  Administered 2013-09-06: 2.5 mg via ORAL
  Filled 2013-09-06: qty 1

## 2013-09-06 MED ORDER — ENALAPRILAT 1.25 MG/ML IV SOLN
0.6250 mg | Freq: Four times a day (QID) | INTRAVENOUS | Status: DC | PRN
  Filled 2013-09-06: qty 0.5

## 2013-09-06 NOTE — Progress Notes (Signed)
Spoke with Cheryl Martinique [phone])  case manager, regarding Mr. Pistilli care. Ms. Martinique voiced concerns about patients plan of care and whether a GI consult would be beneifcial. Dr. Conley Canal notified of concerns.

## 2013-09-06 NOTE — Progress Notes (Signed)
Subjective: 2 Days Post-Op Procedure(s) (LRB): INTRAMEDULLARY (IM) NAIL INTERTROCHANTRIC (Right) Patient reports pain as moderate.  Patient with ileus possible partial bowel obstruction per KUB last night placed NG tube.  Patient reports "more pain in stomach then in hip".  He feels the distention is slightly improved with the NG tube which is still having a fair amount of output.  +flatus, -BM.  Platelet count to 83 apparently this is a chronic problem for him, also had s/p knee replacement surgery.    Objective: Vital signs in last 24 hours: Temp:  [98.1 F (36.7 C)-99.6 F (37.6 C)] 98.6 F (37 C) (03/11 0744) Pulse Rate:  [82-102] 82 (03/11 0744) Resp:  [16-20] 18 (03/11 0744) BP: (111-140)/(68-75) 125/68 mmHg (03/11 0700) SpO2:  [93 %-96 %] 94 % (03/11 0700)  Intake/Output from previous day: 03/10 0701 - 03/11 0700 In: 2160 [P.O.:960; I.V.:1200] Out: 1775 [Urine:1225; Emesis/NG output:550] Intake/Output this shift:     Recent Labs  09/03/13 1947 09/05/13 0413 09/06/13 0538  HGB 15.2 13.1 13.0    Recent Labs  09/05/13 0413 09/06/13 0538  WBC 10.7* 12.8*  RBC 4.00* 4.02*  HCT 38.4* 37.8*  PLT PLATELET CLUMPS NOTED ON SMEAR, COUNT APPEARS DECREASED 83*    Recent Labs  09/05/13 0413 09/06/13 0538  NA 138 137  K 4.1 3.9  CL 101 97  CO2 25 27  BUN 15 22  CREATININE 0.79 0.84  GLUCOSE 123* 140*  CALCIUM 8.5 9.1    Recent Labs  09/04/13 0535 09/06/13 0538  INR 1.07 1.20    Neurovascular intact Sensation intact distally Intact pulses distally Dorsiflexion/Plantar flexion intact Incision: dressing C/D/I No cellulitis present Compartment soft Abdomen hyperresonant, distended, TTP  Assessment/Plan: 2 Days Post-Op Procedure(s) (LRB): INTRAMEDULLARY (IM) NAIL INTERTROCHANTRIC (Right)  Partial WB 50%RLE NPO with NG, per medicine  DVT proph coumadin, SCDs, stockings    Yolander Goodie 09/06/2013, 8:22 AM

## 2013-09-06 NOTE — Care Management Note (Signed)
09/06/13 11:00am. Ricki Miller, RN BSN Case Manager Patient isnt progressing well with therapy,will require inpatient rehab. CM spoke with adjuster-Tara and received permission to Fax  information to Elige Ko, inpt coordinator with [facility] [phone] fax. Phone [phone] ext [phone]. patient -has developed an ileus, has NGT, continues with N/V. Surgery to see her. Case Manager will continue to monitor.

## 2013-09-06 NOTE — Progress Notes (Addendum)
ANTICOAGULATION CONSULT NOTE - Follow up James Schaefer for Coumadin Indication: VTE prophylaxis  No Known Allergies  Patient Measurements: Height: 6\' 5"  (195.6 cm) Weight: 315 lb (142.883 kg) IBW/kg (Calculated) : 89.1   Vital Signs: Temp: 98.6 F (37 C) (03/11 0744) Temp src: Oral (03/11 0744) BP: 125/68 mmHg (03/11 0700) Pulse Rate: 82 (03/11 0744)  Labs:  Recent Labs  09/03/13 1947 09/04/13 0535 09/05/13 0413 09/06/13 0538  HGB 15.2  --  13.1 13.0  HCT 43.3  --  38.4* 37.8*  PLT 137*  --  PLATELET CLUMPS NOTED ON SMEAR, COUNT APPEARS DECREASED 83*  APTT  --  31  --   --   LABPROT 13.1 13.7  --  14.9  INR 1.01 1.07  --  1.20  CREATININE 1.00 0.93 0.79 0.84    Estimated Creatinine Clearance: 140.8 ml/min (by C-G formula based on Cr of 0.84).   Medical History: Past Medical History  Diagnosis Date  . Hypertension   . Hypercholesteremia     Medications:  Prescriptions prior to admission  Medication Sig Dispense Refill  . aspirin EC 81 MG tablet Take 81 mg by mouth every evening.      . hydrochlorothiazide (HYDRODIURIL) 25 MG tablet Take 25 mg by mouth every morning.      Marland Kitchen lisinopril (PRINIVIL,ZESTRIL) 40 MG tablet Take 40 mg by mouth every evening.      . Multiple Vitamins-Minerals (MULTIVITAMIN WITH MINERALS) tablet Take 1 tablet by mouth every evening.      . pravastatin (PRAVACHOL) 40 MG tablet Take 40 mg by mouth every evening.      . vitamin B-12 (CYANOCOBALAMIN) 1000 MCG tablet Take 1,000 mcg by mouth every evening.      . vitamin E 400 UNIT capsule Take 400 Units by mouth every evening.       Scheduled:  . docusate sodium  100 mg Oral BID  . lisinopril  40 mg Oral QPM  . simvastatin  20 mg Oral q1800  . vitamin B-12  1,000 mcg Oral QPM  . warfarin   Does not apply Once  . Warfarin - Pharmacist Dosing Inpatient   Does not apply q1800   Infusions:  . sodium chloride 20 mL/hr (09/03/13 2359)  . lactated ringers 50 mL/hr at 09/05/13  2122    Assessment: 63 y.o male admitted 09/03/13 PM with right hip fracture. Today is POD#2 s/p IM nail right.  On coumadin for DVT prophylaxis. H/o Atrial fibrillation on aspirin 81 mg daily PTA.  Yesterday, lovenox discontinued after 1 dose due to low platelets.  SCDs on. Baseline INR 1.07, H/H 15.2/43.3,  PLTC 137 on admit 09/03/13.  Today the INR = 1.2,  Hgb =13.0 down from 15.2.  PLTC =83K -Ortho noted that apparently this is a chronic problem for him, also had s/p knee replacement surgery. No bleeding noted. Patient with ileus possible partial bowel obstruction per KUB last night placed NG tube.  NPO  Patient will need home health nurse/pharmacy to draw INR & monitor coumadin outpatient.  Warfarin point score =6 points   Goal of Therapy:  INR 2-3 Monitor platelets by anticoagulation protocol: Yes   Plan:  Coumadin 2.5 mg po x1 today Daily Protime/INR Educate patient prior to discharge.  Coumadin book & video ordered.  James Schaefer, RPh Clinical Pharmacist Pager: [phone] 09/06/2013,10:39 AM

## 2013-09-06 NOTE — Discharge Instructions (Addendum)
Diet: As you were doing prior to hospitalization  Activity:  Increase activity slowly as tolerated                  No lifting or driving for 6 weeks  Shower:  May shower without a dressing starting Thursday of this week.  NO SOAKING in tub.  Dressing:  You may change your dressing starting Thursday following shower, cover with clean dry dressing.  Weight Bearing:  50% partial weight bearing as taught in physical therapy.  Use a walker or                    Crutches as instructed.  To prevent constipation: you may use a stool softener such as -               Colace ( over the counter) 100 mg by mouth twice a day                Drink plenty of fluids ( prune juice may be helpful) and high fiber foods                Miralax ( over the counter) for constipation as needed.    Precautions:  If you experience chest pain or shortness of breath - call 911 immediately               For transfer to the hospital emergency department!!               If you develop a fever greater that 101 F, purulent drainage from wound,                             increased redness or drainage from wound, or calf pain -- Call the office  Follow- Up Appointment:  Please call for an appointment to be seen in 2 weeks               [facility] - [phone]   Information on my medicine - Coumadin   (Warfarin)  This medication education was reviewed with me or my healthcare representative as part of my discharge preparation.  The pharmacist that spoke with me during my hospital stay was:  Arman Bogus, [facility]  Why was Coumadin prescribed for you? Coumadin was prescribed for you because you have a blood clot or a medical condition that can cause an increased risk of forming blood clots. Blood clots can cause serious health problems by blocking the flow of blood to the heart, lung, or brain. Coumadin can prevent harmful blood clots from forming. As a reminder your indication for Coumadin is:   Blood Clot  Prevention After Orthopedic Surgery  What test will check on my response to Coumadin? While on Coumadin (warfarin) you will need to have an INR test regularly to ensure that your dose is keeping you in the desired range. The INR (international normalized ratio) number is calculated from the result of the laboratory test called prothrombin time (PT).  If an INR APPOINTMENT HAS NOT ALREADY BEEN MADE FOR YOU please schedule an appointment to have this lab work done by your health care provider within 7 days. Your INR goal is usually a number between:  2 to 3 or your provider may give you a more narrow range like 2-2.5.  Ask your health care provider during an office visit what your goal INR is.  What  do you need to  know  About  COUMADIN? Take Coumadin (warfarin) exactly as prescribed by your healthcare provider about the same time each day.  DO NOT stop taking without talking to the doctor who prescribed the medication.  Stopping without other blood clot prevention medication to take the place of Coumadin may increase your risk of developing a new clot or stroke.  Get refills before you run out.  What do you do if you miss a dose? If you miss a dose, take it as soon as you remember on the same day then continue your regularly scheduled regimen the next day.  Do not take two doses of Coumadin at the same time.  Important Safety Information A possible side effect of Coumadin (Warfarin) is an increased risk of bleeding. You should call your healthcare provider right away if you experience any of the following:   Bleeding from an injury or your nose that does not stop.   Unusual colored urine (red or dark brown) or unusual colored stools (red or black).   Unusual bruising for unknown reasons.   A serious fall or if you hit your head (even if there is no bleeding).  Some foods or medicines interact with Coumadin (warfarin) and might alter your response to warfarin. To help avoid this:   Eat a  balanced diet, maintaining a consistent amount of Vitamin K.   Notify your provider about major diet changes you plan to make.   Avoid alcohol or limit your intake to 1 drink for women and 2 drinks for men per day. (1 drink is 5 oz. wine, 12 oz. beer, or 1.5 oz. liquor.)  Make sure that ANY health care provider who prescribes medication for you knows that you are taking Coumadin (warfarin).  Also make sure the healthcare provider who is monitoring your Coumadin knows when you have started a new medication including herbals and non-prescription products.  Coumadin (Warfarin)  Major Drug Interactions  Increased Warfarin Effect Decreased Warfarin Effect  Alcohol (large quantities) Antibiotics (esp. Septra/Bactrim, Flagyl, Cipro) Amiodarone (Cordarone) Aspirin (ASA) Cimetidine (Tagamet) Megestrol (Megace) NSAIDs (ibuprofen, naproxen, etc.) Piroxicam (Feldene) Propafenone (Rythmol SR) Propranolol (Inderal) Isoniazid (INH) Posaconazole (Noxafil) Barbiturates (Phenobarbital) Carbamazepine (Tegretol) Chlordiazepoxide (Librium) Cholestyramine (Questran) Griseofulvin Oral Contraceptives Rifampin Sucralfate (Carafate) Vitamin K   Coumadin (Warfarin) Major Herbal Interactions  Increased Warfarin Effect Decreased Warfarin Effect  Garlic Ginseng Ginkgo biloba Coenzyme Q10 Green tea St. Johns wort    Coumadin (Warfarin) FOOD Interactions  Eat a consistent number of servings per week of foods HIGH in Vitamin K (1 serving =  cup)  Collards (cooked, or boiled & drained) Kale (cooked, or boiled & drained) Mustard greens (cooked, or boiled & drained) Parsley *serving size only =  cup Spinach (cooked, or boiled & drained) Swiss chard (cooked, or boiled & drained) Turnip greens (cooked, or boiled & drained)  Eat a consistent number of servings per week of foods MEDIUM-HIGH in Vitamin K (1 serving = 1 cup)  Asparagus (cooked, or boiled & drained) Broccoli (cooked, boiled & drained,  or raw & chopped) Brussel sprouts (cooked, or boiled & drained) *serving size only =  cup Lettuce, raw (green leaf, endive, romaine) Spinach, raw Turnip greens, raw & chopped   These websites have more information on Coumadin (warfarin):  FailFactory.se; VeganReport.com.au;

## 2013-09-06 NOTE — Progress Notes (Signed)
Occupational Therapy Evaluation Patient Details Name: James Schaefer MRN: [id number] DOB: 09-Dec-1950 Today's Date: 09/06/2013 Time: 6160-7371 OT Time Calculation (min): 25 min  OT Assessment / Plan / Recommendation History of present illness Pt is a 63 y/o male admitted s/p trip and fall at work, sustaining an intertrochanteric femur fracture. Pt is now s/p IM nailing and is PWB 50%. Pt now with NG tube on low suctioning due to possible partial bowel obstruction.    Clinical Impression   PTA pt lived at home with wife and was Independent in ADLs and mobility. Pt c/o dizziness and nausea upon standing and has not yet ambulated to the bathroom. Per PT note, pt required +2 physical assistance, mod assist level for sit<>stand transfer from bed to recliner. Limited evaluation completed this date due to pt's dizziness, nausea, and NG tube to simulated ADLs to assess potential for activity completion. Education and training completed regarding compensatory strategies for dressing LB. Pt practiced using reacher to remove sock but upon straining with effort, NG tube irritated pt's gag reflex and he began dry heaving. Pt reports that he dry heaves upon exertion with most activities. Pt educated and provided with theraband (level 2, red) for UE exercises and provided handout. Pt would benefit from further OT services to address tub/toilet transfer, LB dressing/bathing, and UE exercises.    OT Assessment  Patient needs continued OT Services    Follow Up Recommendations  CIR       Equipment Recommendations  None recommended by OT       Frequency  Min 2X/week    Precautions / Restrictions Precautions Precautions: Fall Precaution Comments: NG tube Restrictions Weight Bearing Restrictions: Yes RLE Weight Bearing: Partial weight bearing RLE Partial Weight Bearing Percentage or Pounds: 50%   Pertinent Vitals/Pain Pt c/o dizziness and nausea, dry heaves upon exertion due to NG tube    ADL   Eating/Feeding: Independent;Simulated (Pt NPO due to ileus and NG tube but UE WFL for donning/doffi) Where Assessed - Eating/Feeding: Chair Grooming: Set up;Simulated Where Assessed - Grooming: Supported sitting Upper Body Bathing: Set up;Simulated Where Assessed - Upper Body Bathing: Supported sitting Lower Body Bathing: Maximum assistance;Simulated Where Assessed - Lower Body Bathing: Supported sitting Upper Body Dressing: Set up;Simulated Where Assessed - Upper Body Dressing: Supported sitting Lower Body Dressing: Simulated; Total assistance Where Assessed - Lower Body Dressing: Supported sitting Transfers/Ambulation Related to ADLs: Pt c/o dizziness when standing and has been unable to ambulate to the bathroom. Per PT note, pt moved from bed>recliner with +2 physical assist, mod assist level using sit<>stand.   ADL Comments: Limited evaluation completed due to pt c/o nausea and dizziness upon standing. Simulated ADL activities to assess potential to complete tasks. Pt exerted effort attempting to use reacher to doff L sock and began dry heaving. Pt reports that upon exertion the NG tube disturbs his gag reflex and causes dry heaving.      OT Diagnosis: Generalized weakness;Acute pain  OT Problem List: Decreased strength;Decreased range of motion;Decreased activity tolerance;Decreased knowledge of use of DME or AE;Pain OT Treatment Interventions: Self-care/ADL training;Therapeutic exercise;DME and/or AE instruction;Therapeutic activities;Patient/family education   OT Goals(Current goals can be found in the care plan section) Acute Rehab OT Goals Patient Stated Goal: To return to PLOF OT Goal Formulation: With patient Time For Goal Achievement: 09/13/13 Potential to Achieve Goals: Good  Visit Information  Last OT Received On: 09/06/13 Assistance Needed: +2 (safety) History of Present Illness: Pt is a 63 y/o male admitted s/p trip  and fall at work, sustaining an intertrochanteric femur  fracture. Pt is now s/p IM nailing and is PWB 50%. Pt now with NG tube on low suctioning due to possible partial bowel obstruction.        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 Dominant Hand: Left         Vision/Perception Vision - History Patient Visual Report: No change from baseline   Cognition  Cognition Arousal/Alertness: Awake/alert Behavior During Therapy: WFL for tasks assessed/performed Overall Cognitive Status: Within Functional Limits for tasks assessed    Extremity/Trunk Assessment Upper Extremity Assessment Upper Extremity Assessment: Overall WFL for tasks assessed Lower Extremity Assessment Lower Extremity Assessment: Defer to PT evaluation        Exercise General Exercises - Upper Extremity Shoulder Flexion: Strengthening;Both;10 reps;Seated;Theraband Theraband Level (Shoulder Flexion): Level 2 (Red) Shoulder Extension: Strengthening;Both;10 reps;Seated;Theraband Theraband Level (Shoulder Extension): Level 2 (Red) Shoulder ABduction: Strengthening;Both;10 reps;Seated;Theraband Theraband Level (Shoulder Abduction): Level 2 (Red) Shoulder External rotation: Strengthening; Both; 10 reps; Seated; Theraband Theraband Level (Shoulder external rotation): Level 2 (Red) Elbow Flexion: Strengthening;Both;10 reps;Seated;Theraband Theraband Level (Elbow Flexion): Level 2 (Red) Elbow Extension: Strengthening;Both;10  reps;Seated;Theraband Theraband Level (Elbow Extension): Level 2 (Red)       End of Session OT - End of Session Equipment Utilized During Treatment: Other (comment) (theraband (level 2, red)) Activity Tolerance: Other (comment) (pt limited by dizziness, nausea, NG tube) Patient left: in chair;with call bell/phone within reach;with family/visitor present       Juluis Rainier [phone] 09/06/2013, 2:57 PM

## 2013-09-06 NOTE — Progress Notes (Signed)
Patient nausea/ vomiting x2 with abdominal distension and faint bs. Santiago Glad, NP on call notified and orders given. Purpose of NG tube and procedure explained to patient including NPO status. Patient was very excepting and agreeable. NG tube inserted at 2315. Immediate return of gastric fluid. Placement checked and verified. NG connected to wall suction set at intermittent low suction. 17ml of green-brown gastric fluid collected in canister immediately after placement. Patient tolerated well. Will continue to monitor.

## 2013-09-06 NOTE — Progress Notes (Signed)
Rehab Admissions Coordinator Note:  Patient was screened by Cleatrice Burke for appropriateness for an Inpatient Acute Rehab Consult.  At this time, we are recommending Inpatient Rehab consult.  Cleatrice Burke 09/06/2013, 12:28 PM  I can be reached at [phone].

## 2013-09-06 NOTE — Progress Notes (Signed)
Chart reviewed. Events of last night noted.  [facility] PROGRESS NOTE  James Schaefer TGG:[id number] DOB: [birth year] DOA: 09/03/2013 PCP: No primary provider on file.  Assessment/Plan: Principal Problem:  Hip fracture, intertrochanteric Now looking into CIR in [place] per PT/OT recs On coumadin for DVT prophylaxis Active Problems: Postop ileus: NGT still putting out a lot. Having dry heaves from tube.  Will add scheduled reglan. Try dulcolax suppository. Increase IVF while NPO. Repeat xrays in am  Atrial fibrillation  Not on anticoagulation PTA. Tele shows Sinus. Will d/c telemetry. F/u with PCP HYPERTENSION  Change to IV meds while npo Hyperlipidemia Hold statin while NPO  Code Status: Full Family Communication: Wife at bedside Disposition Plan: CIR v. Home with PT, OT when ileus resolved   Consultants:  Orthopedics  Procedures:  None  Antibiotics:  None  HPI/Subjective: C/o intermittent nausea. Vomited last night. Better with NGT. Still distended. No stool. Having flatus  Objective: Filed Vitals:   09/06/13 1300  BP: 111/70  Pulse: 102  Temp: 99.6 F (37.6 C)  Resp: 18    Intake/Output Summary (Last 24 hours) at 09/06/13 1400 Last data filed at 09/06/13 1000  Gross per 24 hour  Intake   1440 ml  Output   1800 ml  Net   -360 ml   Filed Weights   09/03/13 1905  Weight: 142.883 kg (315 lb)    Exam:  General: alert & oriented in chair. Uncomfortable, occasional dry heaves HEENT: NGT draining bilious fluid Cardiovascular: Irregular irregular, rate controlled, nl S1 s2 Respiratory: CTAB Abdomen: soft, distended. Nontender.  High pitched BS Extremities: No cyanosis and no edema  Data Reviewed: Basic Metabolic Panel:  Recent Labs Lab 09/03/13 1947 09/04/13 0535 09/05/13 0413 09/06/13 0538  NA 143 144 138 137  K 4.1 4.0 4.1 3.9  CL 106 106 101 97  CO2 27 26 25 27   GLUCOSE 99 121* 123* 140*  BUN 21 20 15 22   CREATININE 1.00 0.93  0.79 0.84  CALCIUM 9.1 9.0 8.5 9.1   Liver Function Tests:  Recent Labs Lab 09/04/13 0535  AST 18  ALT 18  ALKPHOS 57  BILITOT 0.7  PROT 6.3  ALBUMIN 3.4*   No results found for this basename: LIPASE, AMYLASE,  in the last 168 hours No results found for this basename: AMMONIA,  in the last 168 hours CBC:  Recent Labs Lab 09/03/13 1947 09/05/13 0413 09/06/13 0538  WBC 7.6 10.7* 12.8*  NEUTROABS 5.9  --   --   HGB 15.2 13.1 13.0  HCT 43.3 38.4* 37.8*  MCV 93.7 96.0 94.0  PLT 137* PLATELET CLUMPS NOTED ON SMEAR, COUNT APPEARS DECREASED 83*   Cardiac Enzymes: No results found for this basename: CKTOTAL, CKMB, CKMBINDEX, TROPONINI,  in the last 168 hours BNP (last 3 results) No results found for this basename: PROBNP,  in the last 8760 hours CBG: No results found for this basename: GLUCAP,  in the last 168 hours  Recent Results (from the past 240 hour(s))  URINE CULTURE     Status: None   Collection Time    09/03/13 10:19 PM      Result Value Ref Range Status   Specimen Description URINE, CLEAN CATCH   Final   Special Requests NONE   Final   Culture  Setup Time     Final   Value: 09/04/2013 11:43     Performed at [place]     Final  Value: 8,000 COLONIES/ML     Performed at [facility]     Final   Value: INSIGNIFICANT GROWTH     Performed at [facility]   Report Status 09/05/2013 FINAL   Final  MRSA PCR SCREENING     Status: None   Collection Time    09/04/13 12:25 AM      Result Value Ref Range Status   MRSA by PCR NEGATIVE  NEGATIVE Final   Comment:            The GeneXpert MRSA Assay (FDA     approved for NASAL specimens     only), is one component of a     comprehensive MRSA colonization     surveillance program. It is not     intended to diagnose MRSA     infection nor to guide or     monitor treatment for     MRSA infections.     Studies: Dg Abd Portable 1v  09/05/2013   CLINICAL DATA  Nausea, vomiting, decreased bowel sounds.  Postoperative ileus?  EXAM PORTABLE ABDOMEN - 1 VIEW  COMPARISON 09/03/2013 pelvis radiograph  FINDINGS Suboptimal technique, excluding the periphery of the abdomen. There is gas distention of large and small bowel loops. Cannot exclude free intraperitoneal air by supine projection. Rounded calcific density projecting over the left hemiabdomen may reflect a renal stone. Degenerative changes of the lumbar spine. Sacrum obscured by overlying bowel. Postoperative changes right hip. Surgical clips project over the right groin.  IMPRESSION Gaseous distention of large and small bowel may reflect postoperative ileus or partial/ early obstruction. Correlate with serial physical exam and radiograph follow-up as warranted.  SIGNATURE  Electronically Signed   By: Carlos Levering M.D.   On: 09/05/2013 21:22    Scheduled Meds: . docusate sodium  100 mg Oral BID  . lisinopril  40 mg Oral QPM  . simvastatin  20 mg Oral q1800  . vitamin B-12  1,000 mcg Oral QPM  . warfarin  2.5 mg Oral ONCE-1800  . warfarin   Does not apply Once  . Warfarin - Pharmacist Dosing Inpatient   Does not apply q1800   Continuous Infusions: . sodium chloride 20 mL/hr (09/03/13 2359)  . lactated ringers 50 mL/hr at 09/05/13 2122   Time spent: [place], M.D.  [facility] Pager [phone]. If 7PM-7AM, please contact night-coverage at [email or web address], password [facility] 09/06/2013, 2:00 PM  LOS: 3 days

## 2013-09-06 NOTE — Progress Notes (Signed)
Patient ID: James Schaefer, male   DOB: 04-May-1951, 63 y.o.   MRN: [id number]  Received a call through our outpatient office and spoke with Cheryl Martinique [phone]) case manager, regarding [name] care. Ms. Martinique voiced concerns about patients plan of care and whether a GI consult would be beneifcial.  She also voiced concerns of patients family members.  Discussed concerns with Dr. French Ana, we are aware of post op Ileus which is currently being managed by admitting physician/medicine team.  As of this morning patient stated he had noticed some improvement, had postitive flatus, negative BM.  Will discuss concerns with patient and wife.  Agree with current treatment plan would defer to medicine team recommendations regarding further consult.

## 2013-09-06 NOTE — Progress Notes (Signed)
Physical Therapy Treatment Patient Details Name: James Schaefer MRN: [id number] DOB: [birth year] Today's Date: 09/06/2013 Time: 8315-1761 PT Time Calculation (min): 28 min  PT Assessment / Plan / Recommendation  History of Present Illness Pt is a 63 y/o male admitted s/p trip and fall at work, sustaining an intertrochanteric femur fracture. Pt is now s/p IM nailing and is PWB 50%. Pt now with NG tube on low suctioning due to possible partial bowel obstruction.    PT Comments   Pt progressing slowly towards physical therapy goals. Continues to have difficulty with functional movement and requires significant assist for transfers. Has not ambulated yet. Spoke with pt and wife regarding possible CIR admission and they are agreeable. Feel that pt is a good candidate to progress to a mod I level in CIR environment.    Follow Up Recommendations  CIR     Does the patient have the potential to tolerate intense rehabilitation     Barriers to Discharge        Equipment Recommendations  Rolling walker with 5" wheels;3in1 (PT) (Wide`)    Recommendations for Other Services Rehab consult  Frequency Min 5X/week   Progress towards PT Goals Progress towards PT goals: Progressing toward goals  Plan Current plan remains appropriate    Precautions / Restrictions Precautions Precautions: Fall Precaution Comments: NG tube Restrictions Weight Bearing Restrictions: Yes RLE Weight Bearing: Partial weight bearing RLE Partial Weight Bearing Percentage or Pounds: 50%   Pertinent Vitals/Pain Pt reports 6/10 pain in RLE, and continued abdominal pains. Pt experiencing continued muscle spasms in the R quads. Patient repositioned for comfort.     Mobility  Bed Mobility Overal bed mobility: Needs Assistance Bed Mobility: Supine to Sit Supine to sit: +2 for physical assistance;Mod assist General bed mobility comments: VC's for sequencing and hand placement on bed rails for support. Assist for movement of  RLE. Pt with difficulty scooting to EOB and bed pad was used to complete scooting.  Transfers Overall transfer level: Needs assistance Equipment used: Rolling walker (2 wheeled) Transfers: Sit to/from [facility] Sit to Stand: +2 physical assistance;Mod assist Stand pivot transfers: Mod assist General transfer comment: VC's for hand placement on seated surface for safety. Assist for power-up to full stand, with increased time needed to achieve trunk extension and RLE placement on floor. Mod assist to pivot around to chair, especially for advancement of RLE and cues for posture.     Exercises General Exercises - Lower Extremity Ankle Circles/Pumps: 10 reps Quad Sets: 10 reps Heel Slides: 10 reps;AAROM Hip ABduction/ADduction: 10 reps;AAROM   PT Diagnosis:    PT Problem List:   PT Treatment Interventions:     PT Goals (current goals can now be found in the care plan section) Acute Rehab PT Goals Patient Stated Goal: To return to PLOF PT Goal Formulation: With patient/family Time For Goal Achievement: 09/12/13 Potential to Achieve Goals: Good  Visit Information  Last PT Received On: 09/06/13 History of Present Illness: Pt is a 63 y/o male admitted s/p trip and fall at work, sustaining an intertrochanteric femur fracture. Pt is now s/p IM nailing and is PWB 50%. Pt now with NG tube on low suctioning due to possible partial bowel obstruction.     Subjective Data  Subjective: "I need a lot of help just to move." Patient Stated Goal: To return to PLOF   Cognition  Cognition Arousal/Alertness: Awake/alert Behavior During Therapy: WFL for tasks assessed/performed Overall Cognitive Status: Within Functional Limits for tasks assessed  Balance  Balance Overall balance assessment: Needs assistance Sitting-balance support: Feet supported Sitting balance-Leahy Scale: Good Standing balance support: Bilateral upper extremity supported Standing balance-Leahy Scale:  Poor General Comments General comments (skin integrity, edema, etc.): Increased nausea and gagging after signficant amount of fluid sucked through NG tube.   End of Session PT - End of Session Equipment Utilized During Treatment: Gait belt Activity Tolerance: Patient limited by fatigue;Patient limited by pain Patient left: in chair;with call bell/phone within reach;with family/visitor present Nurse Communication: Mobility status   GP     Jolyn Lent 09/06/2013, 11:49 AM  Jolyn Lent, PT, DPT Acute Rehabilitation Services Pager: [phone]

## 2013-09-07 ENCOUNTER — Inpatient Hospital Stay (HOSPITAL_COMMUNITY): Payer: Worker's Compensation

## 2013-09-07 LAB — BASIC METABOLIC PANEL
BUN: 27 mg/dL — ABNORMAL HIGH (ref 6–23)
CALCIUM: 8.6 mg/dL (ref 8.4–10.5)
CO2: 30 mEq/L (ref 19–32)
Chloride: 98 mEq/L (ref 96–112)
Creatinine, Ser: 0.85 mg/dL (ref 0.50–1.35)
GFR calc Af Amer: >90 mL/min (ref >90)
Glucose, Bld: 125 mg/dL — ABNORMAL HIGH (ref 70–99)
POTASSIUM: 3.7 meq/L (ref 3.7–5.3)
SODIUM: 139 meq/L (ref 137–147)

## 2013-09-07 LAB — CBC
HEMATOCRIT: 36.2 % — AB (ref 39.0–52.0)
Hemoglobin: 12.3 g/dL — ABNORMAL LOW (ref 13.0–17.0)
MCH: 32.5 pg (ref 26.0–34.0)
MCHC: 34 g/dL (ref 30.0–36.0)
MCV: 95.5 fL (ref 78.0–100.0)
PLATELETS: 73 10*3/uL — AB (ref 150–400)
RBC: 3.79 MIL/uL — AB (ref 4.22–5.81)
RDW: 12.9 % (ref 11.5–15.5)
WBC: 10.8 10*3/uL — AB (ref 4.0–10.5)

## 2013-09-07 LAB — MAGNESIUM: MAGNESIUM: 2.5 mg/dL (ref 1.5–2.5)

## 2013-09-07 LAB — PROTIME-INR
INR: 1.21 (ref 0.00–1.49)
Prothrombin Time: 15 seconds (ref 11.6–15.2)

## 2013-09-07 MED ORDER — METOCLOPRAMIDE HCL 10 MG PO TABS: 5.0000 mg | ORAL_TABLET | Freq: Four times a day (QID) | ORAL | Status: DC | PRN

## 2013-09-07 MED ORDER — METOCLOPRAMIDE HCL 5 MG PO TABS
5.0000 mg | ORAL_TABLET | Freq: Three times a day (TID) | ORAL | Status: DC
  Administered 2013-09-07 (×2): 5 mg via ORAL
  Filled 2013-09-07 (×3): qty 1

## 2013-09-07 MED ORDER — BISACODYL 10 MG RE SUPP: 10.0000 mg | Freq: Every day | RECTAL | Status: DC | PRN

## 2013-09-07 MED ORDER — HYDROMORPHONE HCL PF 1 MG/ML IJ SOLN: 0.5000 mg | INTRAMUSCULAR | Status: DC | PRN

## 2013-09-07 MED ORDER — WHITE PETROLATUM GEL
Status: AC
  Administered 2013-09-07: 1
  Filled 2013-09-07: qty 5

## 2013-09-07 MED ORDER — SENNOSIDES-DOCUSATE SODIUM 8.6-50 MG PO TABS: 1.0000 | ORAL_TABLET | Freq: Two times a day (BID) | ORAL | Status: DC

## 2013-09-07 MED ORDER — WARFARIN SODIUM 5 MG PO TABS
5.0000 mg | ORAL_TABLET | Freq: Once | ORAL | Status: AC
  Administered 2013-09-07: 5 mg via ORAL
  Filled 2013-09-07: qty 1

## 2013-09-07 NOTE — Progress Notes (Signed)
ANTICOAGULATION CONSULT NOTE - Follow up [place] for Coumadin Indication: VTE prophylaxis  No Known Allergies  Patient Measurements: Height: 6\' 5"  (195.6 cm) Weight: 315 lb (142.883 kg) IBW/kg (Calculated) : 89.1   Vital Signs: Temp: 98.8 F (37.1 C) (03/12 0615) Temp src: Oral (03/12 0615) BP: 132/70 mmHg (03/12 0615) Pulse Rate: 106 (03/12 0615)  Labs:  Recent Labs  09/05/13 0413 09/06/13 0538 09/07/13 0540  HGB 13.1 13.0 12.3*  HCT 38.4* 37.8* 36.2*  PLT PLATELET CLUMPS NOTED ON SMEAR, COUNT APPEARS DECREASED 83* 73*  LABPROT  --  14.9 15.0  INR  --  1.20 1.21  CREATININE 0.79 0.84 0.85    Estimated Creatinine Clearance: 139.2 ml/min (by C-G formula based on Cr of 0.85).   Medical History: Past Medical History  Diagnosis Date  . Hypertension   . Hypercholesteremia     Medications:  Prescriptions prior to admission  Medication Sig Dispense Refill  . aspirin EC 81 MG tablet Take 81 mg by mouth every evening.      . hydrochlorothiazide (HYDRODIURIL) 25 MG tablet Take 25 mg by mouth every morning.      Marland Kitchen lisinopril (PRINIVIL,ZESTRIL) 40 MG tablet Take 40 mg by mouth every evening.      . Multiple Vitamins-Minerals (MULTIVITAMIN WITH MINERALS) tablet Take 1 tablet by mouth every evening.      . pravastatin (PRAVACHOL) 40 MG tablet Take 40 mg by mouth every evening.      . vitamin B-12 (CYANOCOBALAMIN) 1000 MCG tablet Take 1,000 mcg by mouth every evening.      . vitamin E 400 UNIT capsule Take 400 Units by mouth every evening.       Scheduled:  . metoCLOPramide  5 mg Oral TID AC  . senna-docusate  1 tablet Oral BID  . warfarin  5 mg Oral ONCE-1800  . warfarin   Does not apply Once  . Warfarin - Pharmacist Dosing Inpatient   Does not apply q1800   Infusions:  . sodium chloride 20 mL/hr (09/03/13 2359)  . lactated ringers 20 mL/hr at 09/07/13 1156    Assessment: 63 y.o male admitted 09/03/13 PM with right hip fracture. Today is POD#3  s/p IM nail right.  On coumadin for DVT prophylaxis. H/o Atrial fibrillation on aspirin 81 mg daily PTA.   Lovenox discontinued on 3/10 after 1 dose due to low platelets.  SCDs on. H/H 12.3/36.2,  PLTC 73.  Today INR = 1.2,  Hgb=12.0 down from 15.2.  PLTC =83K -Ortho noted that apparently this is a chronic problem for him. No bleeding noted.   Goal of Therapy:  INR 2-3 Monitor platelets by anticoagulation protocol: Yes   Plan:  Coumadin 5 mg po x1  Daily Protime/INR  Hughes Better, PharmD, BCPS Clinical Pharmacist Pager: [phone] 09/07/2013 1:07 PM

## 2013-09-07 NOTE — Progress Notes (Signed)
PT Cancellation Note  Patient Details Name: James Schaefer MRN: [id number] DOB: 07-Jul-1950   Cancelled Treatment:    Reason Eval/Treat Not Completed: Pt received sitting in bathroom with RLE elevated. Pt states he is unable to participate due to frequent loose bowel movements that have been occuring all day long. Reports improvement with functional mobility, however continues to have difficulty advancing RLE unless there is a paper towel or wash cloth under his foot to slide. Anticipates d/c tomorrow to inpatient rehab in [facility]. Will continue to follow.    Jolyn Lent 09/07/2013, 4:30 PM  Jolyn Lent, PT, DPT Acute Rehabilitation Services Pager: [phone]

## 2013-09-07 NOTE — Progress Notes (Signed)
[facility] PROGRESS NOTE  James Schaefer CM:[id number] DOB: [birth year] DOA: 09/03/2013 PCP: No primary provider on file.  Assessment/Plan: Principal Problem:  Hip fracture, intertrochanteric Now looking into CIR in [place] per PT/OT recs On coumadin for DVT prophylaxis Active Problems: Postop ileus: resolving. Has had multiple bowel movements. Will d/c NGT and start clears. Not ready for discharge Atrial fibrillation  Not on anticoagulation PTA. Tele shows Sinus. Will d/c telemetry. F/u with PCP HYPERTENSION  Controlled. Hyperlipidemia mepilex for small skin tear  Code Status: Full Family Communication: Wife at bedside Disposition Plan: CIR v. Home with PT, OT when ileus resolved   Consultants:  Orthopedics  Procedures:  None  Antibiotics:  None  HPI/Subjective: Large BM last night. Multiple loose stools today. No vomiting. Feels better, less distended  Objective: Filed Vitals:   09/07/13 0800  BP:   Pulse:   Temp:   Resp: 18    Intake/Output Summary (Last 24 hours) at 09/07/13 0953 Last data filed at 09/07/13 0655  Gross per 24 hour  Intake   2100 ml  Output   1650 ml  Net    450 ml   Filed Weights   09/03/13 1905  Weight: 142.883 kg (315 lb)    Exam:  General: alert & oriented in chair.  Cardiovascular: RRR without MGR Respiratory: CTAB Abdomen: soft, less distended. Nontender.  BS normal Skin: ecchymoses right hip. Buttock with skin tear Extremities: No cyanosis and no edema  Data Reviewed: Basic Metabolic Panel:  Recent Labs Lab 09/03/13 1947 09/04/13 0535 09/05/13 0413 09/06/13 0538 09/07/13 0540  NA 143 144 138 137 139  K 4.1 4.0 4.1 3.9 3.7  CL 106 106 101 97 98  CO2 27 26 25 27 30   GLUCOSE 99 121* 123* 140* 125*  BUN 21 20 15 22  27*  CREATININE 1.00 0.93 0.79 0.84 0.85  CALCIUM 9.1 9.0 8.5 9.1 8.6  MG  --   --   --   --  2.5   Liver Function Tests:  Recent Labs Lab 09/04/13 0535  AST 18  ALT 18   ALKPHOS 57  BILITOT 0.7  PROT 6.3  ALBUMIN 3.4*   No results found for this basename: LIPASE, AMYLASE,  in the last 168 hours No results found for this basename: AMMONIA,  in the last 168 hours CBC:  Recent Labs Lab 09/03/13 1947 09/05/13 0413 09/06/13 0538 09/07/13 0540  WBC 7.6 10.7* 12.8* 10.8*  NEUTROABS 5.9  --   --   --   HGB 15.2 13.1 13.0 12.3*  HCT 43.3 38.4* 37.8* 36.2*  MCV 93.7 96.0 94.0 95.5  PLT 137* PLATELET CLUMPS NOTED ON SMEAR, COUNT APPEARS DECREASED 83* 73*   Cardiac Enzymes: No results found for this basename: CKTOTAL, CKMB, CKMBINDEX, TROPONINI,  in the last 168 hours BNP (last 3 results) No results found for this basename: PROBNP,  in the last 8760 hours CBG: No results found for this basename: GLUCAP,  in the last 168 hours  Recent Results (from the past 240 hour(s))  URINE CULTURE     Status: None   Collection Time    09/03/13 10:19 PM      Result Value Ref Range Status   Specimen Description URINE, CLEAN CATCH   Final   Special Requests NONE   Final   Culture  Setup Time     Final   Value: 09/04/2013 11:43     Performed at [place]  Final   Value: 8,000 COLONIES/ML     Performed at [facility]   Culture     Final   Value: INSIGNIFICANT GROWTH     Performed at [facility]   Report Status 09/05/2013 FINAL   Final  MRSA PCR SCREENING     Status: None   Collection Time    09/04/13 12:25 AM      Result Value Ref Range Status   MRSA by PCR NEGATIVE  NEGATIVE Final   Comment:            The GeneXpert MRSA Assay (FDA     approved for NASAL specimens     only), is one component of a     comprehensive MRSA colonization     surveillance program. It is not     intended to diagnose MRSA     infection nor to guide or     monitor treatment for     MRSA infections.     Studies: Dg Abd 2 Views  09/07/2013   CLINICAL DATA:  Nausea and vomiting and bloating. Nasogastric suction is on going.   EXAM: ABDOMEN - 2 VIEW  COMPARISON:  DG ABD PORTABLE 1V dated 09/05/2013  FINDINGS: The stomach exhibits no gaseous distention. The esophagogastric tube is coiled in the gastric cardia. There is a small amount of small bowel gas in the left mid and lower abdomen. There is gaseous distention of the ascending and transverse and descending portions of the colon. There is some gas in the rectosigmoid but little if any in the rectum. No free extraluminal gas collections are demonstrated. No acute bony abnormality is demonstrated. The patient has undergone previous ORIF for right hip fracture. A penile prosthesis is in place.  IMPRESSION: 1. The colonic gas pattern is compatible with an ileus. No significant small bowel ileus or obstructive pattern is demonstrated. There has not been dramatic decrease in the volume of colonic gas present since the earlier study. 2. The positioning of the nasogastric tube is less than optimal with it being coiled in the gastric cardia although no significant gaseous distention of the stomach is demonstrated. It may be useful to advance the esophagogastric tube by at least 10 cm and reimaging the patient.   Electronically Signed   By: David  Martinique   On: 09/07/2013 08:58   Dg Abd Portable 1v  09/05/2013   CLINICAL DATA Nausea, vomiting, decreased bowel sounds.  Postoperative ileus?  EXAM PORTABLE ABDOMEN - 1 VIEW  COMPARISON 09/03/2013 pelvis radiograph  FINDINGS Suboptimal technique, excluding the periphery of the abdomen. There is gas distention of large and small bowel loops. Cannot exclude free intraperitoneal air by supine projection. Rounded calcific density projecting over the left hemiabdomen may reflect a renal stone. Degenerative changes of the lumbar spine. Sacrum obscured by overlying bowel. Postoperative changes right hip. Surgical clips project over the right groin.  IMPRESSION Gaseous distention of large and small bowel may reflect postoperative ileus or partial/ early  obstruction. Correlate with serial physical exam and radiograph follow-up as warranted.  SIGNATURE  Electronically Signed   By: Carlos Levering M.D.   On: 09/05/2013 21:22    Scheduled Meds: . metoCLOPramide  5 mg Oral Q6H  . warfarin   Does not apply Once  . Warfarin - Pharmacist Dosing Inpatient   Does not apply q1800   Continuous Infusions: . sodium chloride 20 mL/hr (09/03/13 2359)  . lactated ringers 100 mL/hr at 09/06/13 1300   Time spent:  [place], M.D.  [facility] Pager [phone]. If 7PM-7AM, please contact night-coverage at [email or web address], password [facility] 09/07/2013, 9:53 AM  LOS: 4 days

## 2013-09-07 NOTE — Progress Notes (Signed)
Subjective: 3 Days Post-Op Procedure(s) (LRB): INTRAMEDULLARY (IM) NAIL INTERTROCHANTRIC (Right) Patient reports pain as mild and moderate.  Multiple BM's, NG tube d/c, "feeling better".  Still with only limited ambulation today due to frequent bowel movements.  Pain well controlled.    Objective: Vital signs in last 24 hours: Temp:  [98.8 F (37.1 C)-101.3 F (38.5 C)] 100.3 F (37.9 C) (03/12 1300) Pulse Rate:  [102-106] 106 (03/12 1300) Resp:  [18-20] 18 (03/12 1600) BP: (125-132)/(68-77) 128/77 mmHg (03/12 1300) SpO2:  [93 %-96 %] 96 % (03/12 1300)  Intake/Output from previous day: 03/11 0701 - 03/12 0700 In: 2100 [I.V.:2100] Out: 2050 [Urine:550; Emesis/NG output:1500] Intake/Output this shift:     Recent Labs  09/05/13 0413 09/06/13 0538 09/07/13 0540  HGB 13.1 13.0 12.3*    Recent Labs  09/06/13 0538 09/07/13 0540  WBC 12.8* 10.8*  RBC 4.02* 3.79*  HCT 37.8* 36.2*  PLT 83* 73*    Recent Labs  09/06/13 0538 09/07/13 0540  NA 137 139  K 3.9 3.7  CL 97 98  CO2 27 30  BUN 22 27*  CREATININE 0.84 0.85  GLUCOSE 140* 125*  CALCIUM 9.1 8.6    Recent Labs  09/06/13 0538 09/07/13 0540  INR 1.20 1.21    Neurovascular intact Sensation intact distally Intact pulses distally Dorsiflexion/Plantar flexion intact Incision: dressing C/D/I and scant drainage No cellulitis present Compartment soft Dressing changed Abdomen still distended   Assessment/Plan: 3 Days Post-Op Procedure(s) (LRB): INTRAMEDULLARY (IM) NAIL INTERTROCHANTRIC (Right) Up with therapy partial weight bearing 50% RLE dvt proph with coumadin, will discuss possible xarelto for d/c as INR has not changed much  Pain meds as ordered Plan for acute inpatient rehab following d/c D/c per medicine team  Chriss Czar 09/07/2013, 4:41 PM

## 2013-09-07 NOTE — Progress Notes (Signed)
OT Cancellation Note and Discharge  Patient Details Name: James Schaefer MRN: [id number] DOB: 1950-12-13   Cancelled Treatment:    Reason Eval/Treat Not Completed: Other (comment); Pt reports d/c for Inpatient Rehab at [facility] planned for tomorrow. OT made visit for AE training and tub/toilet transfer but pt reports no further acute OT concerns at this venue and will address any ADL concerns at Inpatient Rehab facility. Acute OT to sign off.   Juluis Rainier [phone] 09/07/2013, 2:45 PM

## 2013-09-08 LAB — BASIC METABOLIC PANEL
BUN: 25 mg/dL — ABNORMAL HIGH (ref 6–23)
CHLORIDE: 96 meq/L (ref 96–112)
CO2: 29 mEq/L (ref 19–32)
CREATININE: 0.87 mg/dL (ref 0.50–1.35)
Calcium: 8.5 mg/dL (ref 8.4–10.5)
GFR calc Af Amer: >90 mL/min (ref >90)
GFR, EST NON AFRICAN AMERICAN: 90 mL/min — AB (ref >90)
Glucose, Bld: 112 mg/dL — ABNORMAL HIGH (ref 70–99)
Potassium: 3.5 mEq/L — ABNORMAL LOW (ref 3.7–5.3)
Sodium: 139 mEq/L (ref 137–147)

## 2013-09-08 LAB — PROTIME-INR
INR: 1.28 (ref 0.00–1.49)
Prothrombin Time: 15.7 seconds — ABNORMAL HIGH (ref 11.6–15.2)

## 2013-09-08 LAB — CBC
HEMATOCRIT: 34.8 % — AB (ref 39.0–52.0)
Hemoglobin: 12 g/dL — ABNORMAL LOW (ref 13.0–17.0)
MCH: 32.6 pg (ref 26.0–34.0)
MCHC: 34.5 g/dL (ref 30.0–36.0)
MCV: 94.6 fL (ref 78.0–100.0)
Platelets: 74 10*3/uL — ABNORMAL LOW (ref 150–400)
RBC: 3.68 MIL/uL — ABNORMAL LOW (ref 4.22–5.81)
RDW: 12.8 % (ref 11.5–15.5)
WBC: 10.3 10*3/uL (ref 4.0–10.5)

## 2013-09-08 MED ORDER — WARFARIN SODIUM 5 MG PO TABS: 5.0000 mg | ORAL_TABLET | Freq: Every day | ORAL | Status: DC

## 2013-09-08 MED ORDER — LISINOPRIL 10 MG PO TABS: 10.0000 mg | ORAL_TABLET | Freq: Every evening | ORAL | Status: DC

## 2013-09-08 MED ORDER — WARFARIN SODIUM 7.5 MG PO TABS
7.5000 mg | ORAL_TABLET | Freq: Once | ORAL | Status: DC
  Filled 2013-09-08: qty 1

## 2013-09-08 MED ORDER — PROMETHAZINE HCL 12.5 MG PO TABS: 12.5000 mg | ORAL_TABLET | Freq: Four times a day (QID) | ORAL | Status: DC | PRN

## 2013-09-08 MED ORDER — ACETAMINOPHEN 325 MG PO TABS: 650.0000 mg | ORAL_TABLET | Freq: Four times a day (QID) | ORAL | Status: DC | PRN

## 2013-09-08 NOTE — Discharge Summary (Signed)
Physician Discharge Summary  James Schaefer [id number] DOB: [birth year] DOA: 09/03/2013  PCP: No primary provider on file.  Admit date: 09/03/2013 Discharge date: 09/08/2013  Time spent:  greater than 30 minutes  Recommendations for Outpatient Follow-up:  1. Monitor INR daily until greater than or equal to 2.0 2. Adjust coumadin for INR 2.0 - 3.0  Discharge Diagnoses:  Principal Problem:   Hip fracture, intertrochanteric Active Problems:   HYPERCHOLESTEROLEMIA   HYPERTENSION   Hip fracture   Ileus, postoperative paroxysmal atrial fibrillation obesity Chronic thrombocytopenia  Discharge Condition: stable  Filed Weights   09/03/13 1905  Weight: 142.883 kg (315 lb)    History of present illness:  63 y.o. male history of hypertension, hyperlipidemia, previous history of atrial fibrillation, previous history of hyperthyroidism and multinodular goiter status post radioactive iodine ablation had a fall today after tripping. In the ER x-rays show right sided intertrochanteric fracture of the right hip. On-call orthopedic surgeon was consulted and patient is admitted for further workup. Patient denies any dizziness chest pain shortness of breath nausea vomiting abdominal pain focal deficits. Patient states that he tripped and fell did not hit his head or lose consciousness. In the ER EKG shows atrial fibrillation. Looking back to his chart he does have history of atrial fibrillation previously.   Hospital Course:  Admitted to hospitalists. Ortho consuted and took patient to OR on 3/9 for ORIF with trochanteric nail.  Warfarin was started postoperatively for DVT prophylaxis per ortho preference.  Patient had been on ASA only PTA.   Postoperatively, had ileus requiring NG decompression for about 24 hours.  This has since resolved. Patient is having stools, tolerating solids  Patient has remained normotensive off antihypertensives.  Will add ace inhibitor back at decreased dose. May adjust  as indicated.  PT OT recommending inpatient rehab.  Patient stable for transfer today   Procedures:  Right hip ORIF by Dr. French Ana on 09/05/12  Consultations:  French Ana  Discharge Exam: Filed Vitals:   09/08/13 0455  BP: 128/72  Pulse: 100  Temp: 98 F (36.7 C)  Resp: 18    General: alert, oriented, comfortable Cardiovascular: RRR without MGR Respiratory: CTA without WRR Abd: S, NT, much less distended. Bowel sounds present Ext no CCE  Discharge Instructions  Discharge Orders   Future Orders Complete By Expires   Diet - low sodium heart healthy  As directed    Partial weight bearing  As directed    Questions:     % Body Weight:  50   Laterality:  right   Extremity:  Lower       Medication List    STOP taking these medications       aspirin EC 81 MG tablet     hydrochlorothiazide 25 MG tablet  Commonly known as:  HYDRODIURIL     multivitamin with minerals tablet     vitamin E 400 UNIT capsule      TAKE these medications       acetaminophen 325 MG tablet  Commonly known as:  TYLENOL  Take 2 tablets (650 mg total) by mouth every 6 (six) hours as needed for mild pain (or Fever >/= 101).     lisinopril 10 MG tablet  Commonly known as:  PRINIVIL,ZESTRIL  Take 1 tablet (10 mg total) by mouth every evening.     methocarbamol 500 MG tablet  Commonly known as:  ROBAXIN  Take 1 tablet (500 mg total) by mouth every 6 (six) hours as needed for muscle  spasms.     oxyCODONE-acetaminophen 5-325 MG per tablet  Commonly known as:  ROXICET  Take 1-2 tablets by mouth every 4 (four) hours as needed for severe pain.     pravastatin 40 MG tablet  Commonly known as:  PRAVACHOL  Take 40 mg by mouth every evening.     promethazine 12.5 MG tablet  Commonly known as:  PHENERGAN  Take 1 tablet (12.5 mg total) by mouth every 6 (six) hours as needed for nausea or vomiting.     vitamin B-12 1000 MCG tablet  Commonly known as:  CYANOCOBALAMIN  Take 1,000 mcg by mouth  every evening.     warfarin 5 MG tablet  Commonly known as:  COUMADIN  Take 1 tablet (5 mg total) by mouth daily. TO BE ADJUSTED ACCORDING TO INR GOAL RANGE 2-3.  Total time for coumadin 4 weeks post op, d/c on 10/02/13.       No Known Allergies     Follow-up Information   Follow up with CAFFREY JR,W D, MD. Schedule an appointment as soon as possible for a visit in 2 weeks.   Specialty:  Orthopedic Surgery   Contact information:   [address] [address] [phone]        The results of significant diagnostics from this hospitalization (including imaging, microbiology, ancillary and laboratory) are listed below for reference.    Significant Diagnostic Studies: Dg Pelvis 1-2 Views  09/03/2013   CLINICAL DATA:  Patient bloating truck. [place] with is put stuck in a pallet. Right hip and leg pain.  EXAM: PELVIS - 1-2 VIEW  COMPARISON:  None.  FINDINGS: Right proximal femur fracture. Refer to the right femur radiographs for further details.  No additional fractures. Hip joints are normally space and aligned. Bones are demineralized.  IMPRESSION: 1. Right proximal femur intertrochanteric fracture described in detail on the right femur radiographs. 2. No other fracture.  No dislocation.   Electronically Signed   By: Lajean Manes M.D.   On: 09/03/2013 21:21   Dg Hip Operative Right  09/04/2013   CLINICAL DATA:  Right hip fracture.  EXAM: DG OPERATIVE RIGHT HIP  TECHNIQUE: Two spot fluoroscopic AP images of the right hip are submitted.  COMPARISON:  Radiographs dated 09/03/2013  FINDINGS: AP and lateral views of the right hip demonstrate the patient has undergone open reduction and internal fixation of the intertrochanteric fracture of the proximal right femur. Intra medullary rod and 2 compression screws are in place in good position.  IMPRESSION: Open reduction and internal fixation of intertrochanteric fracture of the proximal right femur.   Electronically Signed    By: Rozetta Nunnery M.D.   On: 09/04/2013 15:07   Dg Femur Right  09/03/2013   CLINICAL DATA:  Fall with foot stuck in a pallete.  Right hip pain.  EXAM: RIGHT FEMUR - 2 VIEW  COMPARISON:  None.  FINDINGS: There is a fracture of the proximal femur. This is in trochanteric with evidence of a separate fracture line across the base of the femoral neck. Fracture is mildly displaced, approximately 14 mm. There is mild varus angulation.  No other fracture.  Hip joint is normally space and aligned.  Right knee prosthesis appears well seated and aligned.  IMPRESSION: Intertrochanteric fracture of the proximal right femur as described.   Electronically Signed   By: Lajean Manes M.D.   On: 09/03/2013 21:20   Dg Chest Portable 1 View  09/03/2013   CLINICAL  DATA:  Fall.  EXAM: PORTABLE CHEST - 1 VIEW  COMPARISON:  June 11, 2008.  FINDINGS: The heart size and mediastinal contours are within normal limits. Both lungs are clear. The visualized skeletal structures are unremarkable. Lateral portion of left lung base is not included in field of view.  IMPRESSION: No acute cardiopulmonary abnormality seen.   Electronically Signed   By: Sabino Dick M.D.   On: 09/03/2013 23:07   Dg Abd 2 Views  09/07/2013   CLINICAL DATA:  Nausea and vomiting and bloating. Nasogastric suction is on going.  EXAM: ABDOMEN - 2 VIEW  COMPARISON:  DG ABD PORTABLE 1V dated 09/05/2013  FINDINGS: The stomach exhibits no gaseous distention. The esophagogastric tube is coiled in the gastric cardia. There is a small amount of small bowel gas in the left mid and lower abdomen. There is gaseous distention of the ascending and transverse and descending portions of the colon. There is some gas in the rectosigmoid but little if any in the rectum. No free extraluminal gas collections are demonstrated. No acute bony abnormality is demonstrated. The patient has undergone previous ORIF for right hip fracture. A penile prosthesis is in place.  IMPRESSION: 1. The  colonic gas pattern is compatible with an ileus. No significant small bowel ileus or obstructive pattern is demonstrated. There has not been dramatic decrease in the volume of colonic gas present since the earlier study. 2. The positioning of the nasogastric tube is less than optimal with it being coiled in the gastric cardia although no significant gaseous distention of the stomach is demonstrated. It may be useful to advance the esophagogastric tube by at least 10 cm and reimaging the patient.   Electronically Signed   By: David  Martinique   On: 09/07/2013 08:58   Dg Abd Portable 1v  09/05/2013   CLINICAL DATA Nausea, vomiting, decreased bowel sounds.  Postoperative ileus?  EXAM PORTABLE ABDOMEN - 1 VIEW  COMPARISON 09/03/2013 pelvis radiograph  FINDINGS Suboptimal technique, excluding the periphery of the abdomen. There is gas distention of large and small bowel loops. Cannot exclude free intraperitoneal air by supine projection. Rounded calcific density projecting over the left hemiabdomen may reflect a renal stone. Degenerative changes of the lumbar spine. Sacrum obscured by overlying bowel. Postoperative changes right hip. Surgical clips project over the right groin.  IMPRESSION Gaseous distention of large and small bowel may reflect postoperative ileus or partial/ early obstruction. Correlate with serial physical exam and radiograph follow-up as warranted.  SIGNATURE  Electronically Signed   By: Carlos Levering M.D.   On: 09/05/2013 21:22   EKG Atrial fibrillation  Microbiology: Recent Results (from the past 240 hour(s))  URINE CULTURE     Status: None   Collection Time    09/03/13 10:19 PM      Result Value Ref Range Status   Specimen Description URINE, CLEAN CATCH   Final   Special Requests NONE   Final   Culture  Setup Time     Final   Value: 09/04/2013 11:43     Performed at [place]     Final   Value: 8,000 COLONIES/ML     Performed at [facility]    Culture     Final   Value: INSIGNIFICANT GROWTH     Performed at [facility]   Report Status 09/05/2013 FINAL   Final  MRSA PCR SCREENING     Status: None   Collection Time  09/04/13 12:25 AM      Result Value Ref Range Status   MRSA by PCR NEGATIVE  NEGATIVE Final   Comment:            The GeneXpert MRSA Assay (FDA     approved for NASAL specimens     only), is one component of a     comprehensive MRSA colonization     surveillance program. It is not     intended to diagnose MRSA     infection nor to guide or     monitor treatment for     MRSA infections.     Labs: Basic Metabolic Panel:  Recent Labs Lab 09/04/13 0535 09/05/13 0413 09/06/13 0538 09/07/13 0540 09/08/13 0410  NA 144 138 137 139 139  K 4.0 4.1 3.9 3.7 3.5*  CL 106 101 97 98 96  CO2 26 25 27 30 29   GLUCOSE 121* 123* 140* 125* 112*  BUN 20 15 22  27* 25*  CREATININE 0.93 0.79 0.84 0.85 0.87  CALCIUM 9.0 8.5 9.1 8.6 8.5  MG  --   --   --  2.5  --    Liver Function Tests:  Recent Labs Lab 09/04/13 0535  AST 18  ALT 18  ALKPHOS 57  BILITOT 0.7  PROT 6.3  ALBUMIN 3.4*   No results found for this basename: LIPASE, AMYLASE,  in the last 168 hours No results found for this basename: AMMONIA,  in the last 168 hours CBC:  Recent Labs Lab 09/03/13 1947 09/05/13 0413 09/06/13 0538 09/07/13 0540 09/08/13 0410  WBC 7.6 10.7* 12.8* 10.8* 10.3  NEUTROABS 5.9  --   --   --   --   HGB 15.2 13.1 13.0 12.3* 12.0*  HCT 43.3 38.4* 37.8* 36.2* 34.8*  MCV 93.7 96.0 94.0 95.5 94.6  PLT 137* PLATELET CLUMPS NOTED ON SMEAR, COUNT APPEARS DECREASED 83* 73* 74*   Cardiac Enzymes: No results found for this basename: CKTOTAL, CKMB, CKMBINDEX, TROPONINI,  in the last 168 hours BNP: BNP (last 3 results) No results found for this basename: PROBNP,  in the last 8760 hours CBG: No results found for this basename: GLUCAP,  in the last 168 hours     Signed:  Aeriana Speece L  [facility] 09/08/2013, 10:40 AM

## 2013-09-08 NOTE — Care Management Note (Signed)
CARE MANAGEMENT NOTE 09/08/2013  Patient:  James Schaefer, James Schaefer   Account Number:  [id number]  Date Initiated:  09/04/2013  Documentation initiated by:  Ricki Miller  Subjective/Objective Assessment:   63 yr old male s/pIM Nailing after a fall.     Action/Plan:   Patient is under worker's comp. CM left a message for [place] with [place]-[phone]. CM will follow to arrange [facility] and DME.   Anticipated DC Date:  09/08/2013   Anticipated DC Plan:  IP REHAB FACILITY      DC Planning Services  CM consult      Choice offered to / List presented to:             Status of service:  Completed, signed off Medicare Important Message given?   (If response is "NO", the following Medicare IM given date fields will be blank) Date Medicare IM given:   Date Additional Medicare IM given:    Discharge Disposition:  [place]  Per UR Regulation:    If discussed at Long Length of Stay Meetings, dates discussed:    Comments:  09/08/13 9:30am Ricki Miller, RNBSN Case Manager [phone] patient has been approved for inpatient rehab, Ilieus has resolved, MD has discharged patient,CM spoke with Tally Due and Freda Munro at [facility] rehab. Patient will be transported via ambulance. Discharge summary has been faxed to [facility] @ [phone]. Dr. Sherren Mocha Reiter will be following.  09/06/13 2:00pm Ricki Miller, RN BSN Case Manager [phone] Case manager received call from Cheryl Martinique, [place] for workers comp. [place], Fax [phone]

## 2013-09-08 NOTE — Progress Notes (Signed)
Nutrition Brief Note   Wt Readings from Last 15 Encounters:  09/03/13 315 lb (142.883 kg)  09/03/13 315 lb (142.883 kg)  04/21/10 324 lb (146.965 kg)    Body mass index is 37.35 kg/(m^2). Patient meets criteria for Obesity Class II based on current BMI.   Current diet order is Soft, patient is consuming approximately 100% of meals at this time. Labs and medications reviewed. Potassium low.    No nutrition interventions warranted at this time. If nutrition issues arise, please consult RD.   [place], [place], [place] Pager [phone] After Hours Pager

## 2013-09-08 NOTE — Progress Notes (Signed)
ANTICOAGULATION CONSULT NOTE - Follow up [place] for Coumadin Indication: VTE prophylaxis  No Known Allergies  Patient Measurements: Height: 6\' 5"  (195.6 cm) Weight: 315 lb (142.883 kg) IBW/kg (Calculated) : 89.1   Vital Signs: Temp: 98 F (36.7 C) (03/13 0455) Temp src: Oral (03/13 0455) BP: 128/72 mmHg (03/13 0455) Pulse Rate: 100 (03/13 0455)  Labs:  Recent Labs  09/06/13 0538 09/07/13 0540 09/08/13 0410  HGB 13.0 12.3* 12.0*  HCT 37.8* 36.2* 34.8*  PLT 83* 73* 74*  LABPROT 14.9 15.0 15.7*  INR 1.20 1.21 1.28  CREATININE 0.84 0.85 0.87    Estimated Creatinine Clearance: 136 ml/min (by C-G formula based on Cr of 0.87).   Medical History: Past Medical History  Diagnosis Date  . Hypertension   . Hypercholesteremia     Medications:  Prescriptions prior to admission  Medication Sig Dispense Refill  . pravastatin (PRAVACHOL) 40 MG tablet Take 40 mg by mouth every evening.      . vitamin B-12 (CYANOCOBALAMIN) 1000 MCG tablet Take 1,000 mcg by mouth every evening.      . [DISCONTINUED] aspirin EC 81 MG tablet Take 81 mg by mouth every evening.      . [DISCONTINUED] hydrochlorothiazide (HYDRODIURIL) 25 MG tablet Take 25 mg by mouth every morning.      . [DISCONTINUED] lisinopril (PRINIVIL,ZESTRIL) 40 MG tablet Take 40 mg by mouth every evening.      . [DISCONTINUED] Multiple Vitamins-Minerals (MULTIVITAMIN WITH MINERALS) tablet Take 1 tablet by mouth every evening.      . [DISCONTINUED] vitamin E 400 UNIT capsule Take 400 Units by mouth every evening.       Scheduled:  . warfarin   Does not apply Once  . Warfarin - Pharmacist Dosing Inpatient   Does not apply q1800   Infusions:  . sodium chloride 20 mL/hr (09/03/13 2359)  . lactated ringers 20 mL/hr at 09/07/13 1156    Assessment: 63 y.o male admitted 09/03/13 PM with right hip fracture. Today is POD#4 s/p IM nail right.  On coumadin for DVT prophylaxis. H/o Atrial fibrillation on aspirin 81  mg daily PTA.  Lovenox discontinued on 3/10 after 1 dose due to low platelets.  SCDs charted on (09/07/13). H/H 12.0/34.8,  PLTC 74 <73<83.    Today INR = 1.28 from 1.2,   -Ortho noted that apparently low platelets is a chronic problem for him. No bleeding noted.  Postoperatively, had ileus requiring NG decompression for about 24 hours. This has since resolved. Patient is having stools, tolerating solids  Warfarin predictor point score =6;  INR remains only in the 1.2 to 1.28 range after 3 days of coumadin.  Based on warfarin predictor point score of 6 points for this patient, I will increase dose today to 7.5 mg.     Goal of Therapy:  INR 2-3 Monitor platelets by anticoagulation protocol: Yes   Plan:  Coumadin 7.5 mg po x1  Daily Protime/INR Patient may be discharged to [place] today.   Nicole Cella, RPh Clinical Pharmacist Pager: [phone]  09/08/2013 11:27 AM

## 2013-09-12 ENCOUNTER — Encounter (HOSPITAL_COMMUNITY): Payer: Self-pay | Admitting: Orthopedic Surgery

## 2013-11-21 ENCOUNTER — Telehealth: Payer: Self-pay | Admitting: Endocrinology

## 2013-11-21 NOTE — Telephone Encounter (Signed)
Left message informing pt and requested call back to discuss.

## 2013-11-21 NOTE — Telephone Encounter (Signed)
please call patient: F/u ov is needed for thyroid

## 2013-11-22 NOTE — Telephone Encounter (Signed)
Requested call back to set up appointment.  

## 2013-11-23 NOTE — Telephone Encounter (Signed)
Requested call back to set up appointment.

## 2013-11-24 NOTE — Telephone Encounter (Signed)
Could you try and get in touch with the pt. I have called several times to schedule and have not had any luck.  Thanks!

## 2013-11-28 NOTE — Telephone Encounter (Signed)
Lm for pt to call back to schedule an appt

## 2014-12-23 DIAGNOSIS — I2699 Other pulmonary embolism without acute cor pulmonale: Secondary | ICD-10-CM | POA: Insufficient documentation

## 2015-07-16 DIAGNOSIS — I480 Paroxysmal atrial fibrillation: Secondary | ICD-10-CM | POA: Diagnosis not present

## 2015-07-16 DIAGNOSIS — E7800 Pure hypercholesterolemia, unspecified: Secondary | ICD-10-CM | POA: Diagnosis not present

## 2015-07-16 DIAGNOSIS — I10 Essential (primary) hypertension: Secondary | ICD-10-CM | POA: Diagnosis not present

## 2015-07-16 DIAGNOSIS — Z23 Encounter for immunization: Secondary | ICD-10-CM | POA: Diagnosis not present

## 2015-08-12 DIAGNOSIS — I480 Paroxysmal atrial fibrillation: Secondary | ICD-10-CM | POA: Diagnosis not present

## 2015-08-12 DIAGNOSIS — I4891 Unspecified atrial fibrillation: Secondary | ICD-10-CM | POA: Diagnosis not present

## 2015-08-12 DIAGNOSIS — I4892 Unspecified atrial flutter: Secondary | ICD-10-CM | POA: Diagnosis not present

## 2015-08-12 DIAGNOSIS — I2699 Other pulmonary embolism without acute cor pulmonale: Secondary | ICD-10-CM | POA: Diagnosis not present

## 2015-09-18 DIAGNOSIS — I10 Essential (primary) hypertension: Secondary | ICD-10-CM | POA: Diagnosis not present

## 2015-09-18 DIAGNOSIS — Z9181 History of falling: Secondary | ICD-10-CM | POA: Diagnosis not present

## 2015-09-18 DIAGNOSIS — Z0000 Encounter for general adult medical examination without abnormal findings: Secondary | ICD-10-CM | POA: Diagnosis not present

## 2015-09-18 DIAGNOSIS — E785 Hyperlipidemia, unspecified: Secondary | ICD-10-CM | POA: Diagnosis not present

## 2015-09-18 DIAGNOSIS — Z1159 Encounter for screening for other viral diseases: Secondary | ICD-10-CM | POA: Diagnosis not present

## 2015-09-18 DIAGNOSIS — L570 Actinic keratosis: Secondary | ICD-10-CM | POA: Diagnosis not present

## 2015-09-27 DIAGNOSIS — L578 Other skin changes due to chronic exposure to nonionizing radiation: Secondary | ICD-10-CM | POA: Diagnosis not present

## 2015-09-27 DIAGNOSIS — L820 Inflamed seborrheic keratosis: Secondary | ICD-10-CM | POA: Diagnosis not present

## 2015-09-27 DIAGNOSIS — D225 Melanocytic nevi of trunk: Secondary | ICD-10-CM | POA: Diagnosis not present

## 2015-09-27 DIAGNOSIS — L821 Other seborrheic keratosis: Secondary | ICD-10-CM | POA: Diagnosis not present

## 2015-09-30 DIAGNOSIS — K644 Residual hemorrhoidal skin tags: Secondary | ICD-10-CM | POA: Diagnosis not present

## 2015-09-30 DIAGNOSIS — Z86010 Personal history of colonic polyps: Secondary | ICD-10-CM | POA: Diagnosis not present

## 2015-10-25 DIAGNOSIS — Z86010 Personal history of colonic polyps: Secondary | ICD-10-CM | POA: Diagnosis not present

## 2015-10-25 DIAGNOSIS — D126 Benign neoplasm of colon, unspecified: Secondary | ICD-10-CM | POA: Diagnosis not present

## 2015-10-25 DIAGNOSIS — M1990 Unspecified osteoarthritis, unspecified site: Secondary | ICD-10-CM | POA: Diagnosis not present

## 2015-10-25 DIAGNOSIS — D122 Benign neoplasm of ascending colon: Secondary | ICD-10-CM | POA: Diagnosis not present

## 2015-10-25 DIAGNOSIS — Z79899 Other long term (current) drug therapy: Secondary | ICD-10-CM | POA: Diagnosis not present

## 2015-10-25 DIAGNOSIS — I10 Essential (primary) hypertension: Secondary | ICD-10-CM | POA: Diagnosis not present

## 2015-10-25 DIAGNOSIS — E039 Hypothyroidism, unspecified: Secondary | ICD-10-CM | POA: Diagnosis not present

## 2015-10-25 DIAGNOSIS — K644 Residual hemorrhoidal skin tags: Secondary | ICD-10-CM | POA: Diagnosis not present

## 2015-10-25 DIAGNOSIS — Z1211 Encounter for screening for malignant neoplasm of colon: Secondary | ICD-10-CM | POA: Diagnosis not present

## 2015-10-25 DIAGNOSIS — K648 Other hemorrhoids: Secondary | ICD-10-CM | POA: Diagnosis not present

## 2016-01-15 DIAGNOSIS — Z125 Encounter for screening for malignant neoplasm of prostate: Secondary | ICD-10-CM | POA: Diagnosis not present

## 2016-01-15 DIAGNOSIS — E785 Hyperlipidemia, unspecified: Secondary | ICD-10-CM | POA: Diagnosis not present

## 2016-01-15 DIAGNOSIS — D519 Vitamin B12 deficiency anemia, unspecified: Secondary | ICD-10-CM | POA: Diagnosis not present

## 2016-01-15 DIAGNOSIS — Z0000 Encounter for general adult medical examination without abnormal findings: Secondary | ICD-10-CM | POA: Diagnosis not present

## 2016-01-15 DIAGNOSIS — Z79899 Other long term (current) drug therapy: Secondary | ICD-10-CM | POA: Diagnosis not present

## 2016-02-11 DIAGNOSIS — Z83518 Family history of other specified eye disorder: Secondary | ICD-10-CM | POA: Diagnosis not present

## 2016-02-11 DIAGNOSIS — D3131 Benign neoplasm of right choroid: Secondary | ICD-10-CM | POA: Diagnosis not present

## 2016-02-11 DIAGNOSIS — Z8249 Family history of ischemic heart disease and other diseases of the circulatory system: Secondary | ICD-10-CM | POA: Diagnosis not present

## 2016-02-11 DIAGNOSIS — D3132 Benign neoplasm of left choroid: Secondary | ICD-10-CM | POA: Diagnosis not present

## 2016-02-11 DIAGNOSIS — I10 Essential (primary) hypertension: Secondary | ICD-10-CM | POA: Diagnosis not present

## 2016-02-11 DIAGNOSIS — H2513 Age-related nuclear cataract, bilateral: Secondary | ICD-10-CM | POA: Diagnosis not present

## 2016-03-12 DIAGNOSIS — I8312 Varicose veins of left lower extremity with inflammation: Secondary | ICD-10-CM | POA: Diagnosis not present

## 2016-03-12 DIAGNOSIS — D225 Melanocytic nevi of trunk: Secondary | ICD-10-CM | POA: Diagnosis not present

## 2016-03-12 DIAGNOSIS — L814 Other melanin hyperpigmentation: Secondary | ICD-10-CM | POA: Diagnosis not present

## 2016-03-12 DIAGNOSIS — L570 Actinic keratosis: Secondary | ICD-10-CM | POA: Diagnosis not present

## 2016-03-12 DIAGNOSIS — I8311 Varicose veins of right lower extremity with inflammation: Secondary | ICD-10-CM | POA: Diagnosis not present

## 2016-07-27 DIAGNOSIS — I480 Paroxysmal atrial fibrillation: Secondary | ICD-10-CM | POA: Diagnosis not present

## 2016-07-27 DIAGNOSIS — I10 Essential (primary) hypertension: Secondary | ICD-10-CM | POA: Diagnosis not present

## 2016-07-27 DIAGNOSIS — Z1389 Encounter for screening for other disorder: Secondary | ICD-10-CM | POA: Diagnosis not present

## 2016-07-27 DIAGNOSIS — E785 Hyperlipidemia, unspecified: Secondary | ICD-10-CM | POA: Diagnosis not present

## 2016-07-27 DIAGNOSIS — Z79899 Other long term (current) drug therapy: Secondary | ICD-10-CM | POA: Diagnosis not present

## 2016-08-11 DIAGNOSIS — I480 Paroxysmal atrial fibrillation: Secondary | ICD-10-CM | POA: Diagnosis not present

## 2016-08-11 DIAGNOSIS — I2699 Other pulmonary embolism without acute cor pulmonale: Secondary | ICD-10-CM | POA: Diagnosis not present

## 2016-08-11 DIAGNOSIS — E782 Mixed hyperlipidemia: Secondary | ICD-10-CM | POA: Diagnosis not present

## 2016-09-10 DIAGNOSIS — L814 Other melanin hyperpigmentation: Secondary | ICD-10-CM | POA: Diagnosis not present

## 2016-09-10 DIAGNOSIS — L821 Other seborrheic keratosis: Secondary | ICD-10-CM | POA: Diagnosis not present

## 2016-09-10 DIAGNOSIS — D1801 Hemangioma of skin and subcutaneous tissue: Secondary | ICD-10-CM | POA: Diagnosis not present

## 2016-09-10 DIAGNOSIS — D225 Melanocytic nevi of trunk: Secondary | ICD-10-CM | POA: Diagnosis not present

## 2016-09-10 DIAGNOSIS — L570 Actinic keratosis: Secondary | ICD-10-CM | POA: Diagnosis not present

## 2017-02-01 DIAGNOSIS — Z9181 History of falling: Secondary | ICD-10-CM | POA: Diagnosis not present

## 2017-02-01 DIAGNOSIS — Z79899 Other long term (current) drug therapy: Secondary | ICD-10-CM | POA: Diagnosis not present

## 2017-02-01 DIAGNOSIS — Z0000 Encounter for general adult medical examination without abnormal findings: Secondary | ICD-10-CM | POA: Diagnosis not present

## 2017-02-01 DIAGNOSIS — Z23 Encounter for immunization: Secondary | ICD-10-CM | POA: Diagnosis not present

## 2017-02-01 DIAGNOSIS — L989 Disorder of the skin and subcutaneous tissue, unspecified: Secondary | ICD-10-CM | POA: Diagnosis not present

## 2017-02-01 DIAGNOSIS — Z125 Encounter for screening for malignant neoplasm of prostate: Secondary | ICD-10-CM | POA: Diagnosis not present

## 2017-02-01 DIAGNOSIS — E785 Hyperlipidemia, unspecified: Secondary | ICD-10-CM | POA: Diagnosis not present

## 2017-02-01 DIAGNOSIS — D519 Vitamin B12 deficiency anemia, unspecified: Secondary | ICD-10-CM | POA: Diagnosis not present

## 2017-02-01 DIAGNOSIS — Z6841 Body Mass Index (BMI) 40.0 and over, adult: Secondary | ICD-10-CM | POA: Diagnosis not present

## 2017-02-17 DIAGNOSIS — H2513 Age-related nuclear cataract, bilateral: Secondary | ICD-10-CM | POA: Diagnosis not present

## 2017-02-17 DIAGNOSIS — D3132 Benign neoplasm of left choroid: Secondary | ICD-10-CM | POA: Diagnosis not present

## 2017-02-17 DIAGNOSIS — D3131 Benign neoplasm of right choroid: Secondary | ICD-10-CM | POA: Diagnosis not present

## 2017-03-26 DIAGNOSIS — Z8603 Personal history of neoplasm of uncertain behavior: Secondary | ICD-10-CM | POA: Diagnosis not present

## 2017-03-26 DIAGNOSIS — Z809 Family history of malignant neoplasm, unspecified: Secondary | ICD-10-CM | POA: Diagnosis not present

## 2017-03-26 DIAGNOSIS — D1801 Hemangioma of skin and subcutaneous tissue: Secondary | ICD-10-CM | POA: Diagnosis not present

## 2017-03-26 DIAGNOSIS — L820 Inflamed seborrheic keratosis: Secondary | ICD-10-CM | POA: Diagnosis not present

## 2017-03-26 DIAGNOSIS — D225 Melanocytic nevi of trunk: Secondary | ICD-10-CM | POA: Diagnosis not present

## 2017-03-26 DIAGNOSIS — D485 Neoplasm of uncertain behavior of skin: Secondary | ICD-10-CM | POA: Diagnosis not present

## 2017-04-19 DIAGNOSIS — E7800 Pure hypercholesterolemia, unspecified: Secondary | ICD-10-CM | POA: Diagnosis not present

## 2017-04-19 DIAGNOSIS — N419 Inflammatory disease of prostate, unspecified: Secondary | ICD-10-CM | POA: Diagnosis not present

## 2017-04-19 DIAGNOSIS — Z6841 Body Mass Index (BMI) 40.0 and over, adult: Secondary | ICD-10-CM | POA: Diagnosis not present

## 2017-04-19 DIAGNOSIS — I480 Paroxysmal atrial fibrillation: Secondary | ICD-10-CM | POA: Diagnosis not present

## 2017-04-19 DIAGNOSIS — Z1339 Encounter for screening examination for other mental health and behavioral disorders: Secondary | ICD-10-CM | POA: Diagnosis not present

## 2017-04-19 DIAGNOSIS — I10 Essential (primary) hypertension: Secondary | ICD-10-CM | POA: Diagnosis not present

## 2017-04-22 DIAGNOSIS — Z7901 Long term (current) use of anticoagulants: Secondary | ICD-10-CM | POA: Diagnosis not present

## 2017-04-22 DIAGNOSIS — M47896 Other spondylosis, lumbar region: Secondary | ICD-10-CM | POA: Diagnosis not present

## 2017-04-22 DIAGNOSIS — R3129 Other microscopic hematuria: Secondary | ICD-10-CM | POA: Diagnosis not present

## 2017-04-22 DIAGNOSIS — R109 Unspecified abdominal pain: Secondary | ICD-10-CM | POA: Diagnosis not present

## 2017-05-07 DIAGNOSIS — R319 Hematuria, unspecified: Secondary | ICD-10-CM | POA: Diagnosis not present

## 2017-05-10 DIAGNOSIS — N528 Other male erectile dysfunction: Secondary | ICD-10-CM | POA: Diagnosis not present

## 2017-05-10 DIAGNOSIS — N401 Enlarged prostate with lower urinary tract symptoms: Secondary | ICD-10-CM | POA: Diagnosis not present

## 2017-05-10 DIAGNOSIS — R3129 Other microscopic hematuria: Secondary | ICD-10-CM | POA: Diagnosis not present

## 2017-05-10 DIAGNOSIS — R310 Gross hematuria: Secondary | ICD-10-CM | POA: Diagnosis not present

## 2017-10-04 DIAGNOSIS — Z1331 Encounter for screening for depression: Secondary | ICD-10-CM | POA: Diagnosis not present

## 2017-10-04 DIAGNOSIS — E7800 Pure hypercholesterolemia, unspecified: Secondary | ICD-10-CM | POA: Diagnosis not present

## 2017-10-04 DIAGNOSIS — I480 Paroxysmal atrial fibrillation: Secondary | ICD-10-CM | POA: Diagnosis not present

## 2017-10-04 DIAGNOSIS — I10 Essential (primary) hypertension: Secondary | ICD-10-CM | POA: Diagnosis not present

## 2017-10-04 DIAGNOSIS — Z6841 Body Mass Index (BMI) 40.0 and over, adult: Secondary | ICD-10-CM | POA: Diagnosis not present

## 2017-11-16 DIAGNOSIS — I480 Paroxysmal atrial fibrillation: Secondary | ICD-10-CM | POA: Diagnosis not present

## 2017-11-16 DIAGNOSIS — I2609 Other pulmonary embolism with acute cor pulmonale: Secondary | ICD-10-CM | POA: Diagnosis not present

## 2017-11-17 DIAGNOSIS — I480 Paroxysmal atrial fibrillation: Secondary | ICD-10-CM | POA: Diagnosis not present

## 2018-02-23 DIAGNOSIS — H5203 Hypermetropia, bilateral: Secondary | ICD-10-CM | POA: Diagnosis not present

## 2018-02-23 DIAGNOSIS — H268 Other specified cataract: Secondary | ICD-10-CM | POA: Diagnosis not present

## 2018-02-23 DIAGNOSIS — H524 Presbyopia: Secondary | ICD-10-CM | POA: Diagnosis not present

## 2018-02-23 DIAGNOSIS — D3131 Benign neoplasm of right choroid: Secondary | ICD-10-CM | POA: Insufficient documentation

## 2018-02-23 DIAGNOSIS — H2513 Age-related nuclear cataract, bilateral: Secondary | ICD-10-CM | POA: Diagnosis not present

## 2018-02-23 DIAGNOSIS — H25043 Posterior subcapsular polar age-related cataract, bilateral: Secondary | ICD-10-CM | POA: Insufficient documentation

## 2018-02-23 DIAGNOSIS — H52223 Regular astigmatism, bilateral: Secondary | ICD-10-CM | POA: Diagnosis not present

## 2018-02-23 DIAGNOSIS — D3132 Benign neoplasm of left choroid: Secondary | ICD-10-CM | POA: Diagnosis not present

## 2018-03-01 DIAGNOSIS — I480 Paroxysmal atrial fibrillation: Secondary | ICD-10-CM | POA: Diagnosis not present

## 2018-03-01 DIAGNOSIS — Z9181 History of falling: Secondary | ICD-10-CM | POA: Diagnosis not present

## 2018-03-01 DIAGNOSIS — E785 Hyperlipidemia, unspecified: Secondary | ICD-10-CM | POA: Diagnosis not present

## 2018-03-01 DIAGNOSIS — N419 Inflammatory disease of prostate, unspecified: Secondary | ICD-10-CM | POA: Diagnosis not present

## 2018-03-01 DIAGNOSIS — Z79899 Other long term (current) drug therapy: Secondary | ICD-10-CM | POA: Diagnosis not present

## 2018-03-01 DIAGNOSIS — Z6841 Body Mass Index (BMI) 40.0 and over, adult: Secondary | ICD-10-CM | POA: Diagnosis not present

## 2018-03-01 DIAGNOSIS — Z0000 Encounter for general adult medical examination without abnormal findings: Secondary | ICD-10-CM | POA: Diagnosis not present

## 2018-07-12 DIAGNOSIS — D225 Melanocytic nevi of trunk: Secondary | ICD-10-CM | POA: Diagnosis not present

## 2018-07-12 DIAGNOSIS — L905 Scar conditions and fibrosis of skin: Secondary | ICD-10-CM | POA: Diagnosis not present

## 2018-07-12 DIAGNOSIS — D1801 Hemangioma of skin and subcutaneous tissue: Secondary | ICD-10-CM | POA: Diagnosis not present

## 2018-07-12 DIAGNOSIS — L578 Other skin changes due to chronic exposure to nonionizing radiation: Secondary | ICD-10-CM | POA: Diagnosis not present

## 2018-07-12 DIAGNOSIS — L570 Actinic keratosis: Secondary | ICD-10-CM | POA: Diagnosis not present

## 2018-08-31 DIAGNOSIS — I480 Paroxysmal atrial fibrillation: Secondary | ICD-10-CM | POA: Diagnosis not present

## 2018-08-31 DIAGNOSIS — Z6841 Body Mass Index (BMI) 40.0 and over, adult: Secondary | ICD-10-CM | POA: Diagnosis not present

## 2018-08-31 DIAGNOSIS — E7800 Pure hypercholesterolemia, unspecified: Secondary | ICD-10-CM | POA: Diagnosis not present

## 2018-08-31 DIAGNOSIS — I10 Essential (primary) hypertension: Secondary | ICD-10-CM | POA: Diagnosis not present

## 2018-09-27 DIAGNOSIS — E785 Hyperlipidemia, unspecified: Secondary | ICD-10-CM | POA: Diagnosis not present

## 2018-09-27 DIAGNOSIS — I2699 Other pulmonary embolism without acute cor pulmonale: Secondary | ICD-10-CM | POA: Diagnosis not present

## 2018-09-27 DIAGNOSIS — I10 Essential (primary) hypertension: Secondary | ICD-10-CM | POA: Diagnosis not present

## 2018-10-27 DIAGNOSIS — I2699 Other pulmonary embolism without acute cor pulmonale: Secondary | ICD-10-CM | POA: Diagnosis not present

## 2018-10-27 DIAGNOSIS — E0590 Thyrotoxicosis, unspecified without thyrotoxic crisis or storm: Secondary | ICD-10-CM | POA: Diagnosis not present

## 2018-11-23 DIAGNOSIS — I10 Essential (primary) hypertension: Secondary | ICD-10-CM | POA: Diagnosis not present

## 2018-11-23 DIAGNOSIS — I4821 Permanent atrial fibrillation: Secondary | ICD-10-CM | POA: Diagnosis not present

## 2018-11-23 DIAGNOSIS — I480 Paroxysmal atrial fibrillation: Secondary | ICD-10-CM | POA: Diagnosis not present

## 2018-11-23 DIAGNOSIS — I2693 Single subsegmental pulmonary embolism without acute cor pulmonale: Secondary | ICD-10-CM | POA: Diagnosis not present

## 2018-11-24 DIAGNOSIS — I480 Paroxysmal atrial fibrillation: Secondary | ICD-10-CM | POA: Diagnosis not present

## 2018-11-26 DIAGNOSIS — I10 Essential (primary) hypertension: Secondary | ICD-10-CM | POA: Diagnosis not present

## 2018-11-26 DIAGNOSIS — E785 Hyperlipidemia, unspecified: Secondary | ICD-10-CM | POA: Diagnosis not present

## 2019-01-27 DIAGNOSIS — E785 Hyperlipidemia, unspecified: Secondary | ICD-10-CM | POA: Diagnosis not present

## 2019-01-27 DIAGNOSIS — I10 Essential (primary) hypertension: Secondary | ICD-10-CM | POA: Diagnosis not present

## 2019-03-21 DIAGNOSIS — Z9181 History of falling: Secondary | ICD-10-CM | POA: Diagnosis not present

## 2019-03-21 DIAGNOSIS — I10 Essential (primary) hypertension: Secondary | ICD-10-CM | POA: Diagnosis not present

## 2019-03-21 DIAGNOSIS — Z1331 Encounter for screening for depression: Secondary | ICD-10-CM | POA: Diagnosis not present

## 2019-03-21 DIAGNOSIS — Z79899 Other long term (current) drug therapy: Secondary | ICD-10-CM | POA: Diagnosis not present

## 2019-03-21 DIAGNOSIS — E785 Hyperlipidemia, unspecified: Secondary | ICD-10-CM | POA: Diagnosis not present

## 2019-03-21 DIAGNOSIS — Z0000 Encounter for general adult medical examination without abnormal findings: Secondary | ICD-10-CM | POA: Diagnosis not present

## 2019-03-21 DIAGNOSIS — Z6841 Body Mass Index (BMI) 40.0 and over, adult: Secondary | ICD-10-CM | POA: Diagnosis not present

## 2019-03-21 DIAGNOSIS — Z23 Encounter for immunization: Secondary | ICD-10-CM | POA: Diagnosis not present

## 2019-03-29 DIAGNOSIS — I10 Essential (primary) hypertension: Secondary | ICD-10-CM | POA: Diagnosis not present

## 2019-03-29 DIAGNOSIS — I480 Paroxysmal atrial fibrillation: Secondary | ICD-10-CM | POA: Diagnosis not present

## 2019-03-29 DIAGNOSIS — E0590 Thyrotoxicosis, unspecified without thyrotoxic crisis or storm: Secondary | ICD-10-CM | POA: Diagnosis not present

## 2019-03-29 DIAGNOSIS — E785 Hyperlipidemia, unspecified: Secondary | ICD-10-CM | POA: Diagnosis not present

## 2019-04-14 DIAGNOSIS — H52223 Regular astigmatism, bilateral: Secondary | ICD-10-CM | POA: Diagnosis not present

## 2019-04-14 DIAGNOSIS — H2513 Age-related nuclear cataract, bilateral: Secondary | ICD-10-CM | POA: Diagnosis not present

## 2019-04-14 DIAGNOSIS — D3131 Benign neoplasm of right choroid: Secondary | ICD-10-CM | POA: Diagnosis not present

## 2019-04-14 DIAGNOSIS — H524 Presbyopia: Secondary | ICD-10-CM | POA: Diagnosis not present

## 2019-04-14 DIAGNOSIS — H25011 Cortical age-related cataract, right eye: Secondary | ICD-10-CM | POA: Diagnosis not present

## 2019-04-14 DIAGNOSIS — D3132 Benign neoplasm of left choroid: Secondary | ICD-10-CM | POA: Diagnosis not present

## 2019-04-14 DIAGNOSIS — H25043 Posterior subcapsular polar age-related cataract, bilateral: Secondary | ICD-10-CM | POA: Diagnosis not present

## 2019-04-14 DIAGNOSIS — H5203 Hypermetropia, bilateral: Secondary | ICD-10-CM | POA: Diagnosis not present

## 2019-04-28 DIAGNOSIS — I10 Essential (primary) hypertension: Secondary | ICD-10-CM | POA: Diagnosis not present

## 2019-04-28 DIAGNOSIS — E7800 Pure hypercholesterolemia, unspecified: Secondary | ICD-10-CM | POA: Diagnosis not present

## 2019-05-29 DIAGNOSIS — E7800 Pure hypercholesterolemia, unspecified: Secondary | ICD-10-CM | POA: Diagnosis not present

## 2019-05-29 DIAGNOSIS — I10 Essential (primary) hypertension: Secondary | ICD-10-CM | POA: Diagnosis not present

## 2019-05-29 DIAGNOSIS — I480 Paroxysmal atrial fibrillation: Secondary | ICD-10-CM | POA: Diagnosis not present

## 2019-06-13 DIAGNOSIS — L820 Inflamed seborrheic keratosis: Secondary | ICD-10-CM | POA: Diagnosis not present

## 2019-06-13 DIAGNOSIS — L578 Other skin changes due to chronic exposure to nonionizing radiation: Secondary | ICD-10-CM | POA: Diagnosis not present

## 2019-06-13 DIAGNOSIS — D225 Melanocytic nevi of trunk: Secondary | ICD-10-CM | POA: Diagnosis not present

## 2019-06-13 DIAGNOSIS — L720 Epidermal cyst: Secondary | ICD-10-CM | POA: Diagnosis not present

## 2019-06-13 DIAGNOSIS — D485 Neoplasm of uncertain behavior of skin: Secondary | ICD-10-CM | POA: Diagnosis not present

## 2019-06-13 DIAGNOSIS — D1801 Hemangioma of skin and subcutaneous tissue: Secondary | ICD-10-CM | POA: Diagnosis not present

## 2019-06-13 DIAGNOSIS — L821 Other seborrheic keratosis: Secondary | ICD-10-CM | POA: Diagnosis not present

## 2019-07-29 DIAGNOSIS — I10 Essential (primary) hypertension: Secondary | ICD-10-CM | POA: Diagnosis not present

## 2019-07-29 DIAGNOSIS — E7800 Pure hypercholesterolemia, unspecified: Secondary | ICD-10-CM | POA: Diagnosis not present

## 2019-07-29 DIAGNOSIS — N419 Inflammatory disease of prostate, unspecified: Secondary | ICD-10-CM | POA: Diagnosis not present

## 2019-08-27 DIAGNOSIS — I480 Paroxysmal atrial fibrillation: Secondary | ICD-10-CM | POA: Diagnosis not present

## 2019-08-27 DIAGNOSIS — E7800 Pure hypercholesterolemia, unspecified: Secondary | ICD-10-CM | POA: Diagnosis not present

## 2019-09-14 DIAGNOSIS — Z6841 Body Mass Index (BMI) 40.0 and over, adult: Secondary | ICD-10-CM | POA: Diagnosis not present

## 2019-09-14 DIAGNOSIS — I10 Essential (primary) hypertension: Secondary | ICD-10-CM | POA: Diagnosis not present

## 2019-09-14 DIAGNOSIS — Z1331 Encounter for screening for depression: Secondary | ICD-10-CM | POA: Diagnosis not present

## 2019-09-14 DIAGNOSIS — N400 Enlarged prostate without lower urinary tract symptoms: Secondary | ICD-10-CM | POA: Diagnosis not present

## 2019-09-27 DIAGNOSIS — I10 Essential (primary) hypertension: Secondary | ICD-10-CM | POA: Diagnosis not present

## 2019-09-27 DIAGNOSIS — I4891 Unspecified atrial fibrillation: Secondary | ICD-10-CM | POA: Diagnosis not present

## 2019-11-27 DIAGNOSIS — I10 Essential (primary) hypertension: Secondary | ICD-10-CM | POA: Diagnosis not present

## 2019-11-27 DIAGNOSIS — E785 Hyperlipidemia, unspecified: Secondary | ICD-10-CM | POA: Diagnosis not present

## 2019-11-29 DIAGNOSIS — Z6841 Body Mass Index (BMI) 40.0 and over, adult: Secondary | ICD-10-CM | POA: Diagnosis not present

## 2019-11-29 DIAGNOSIS — I872 Venous insufficiency (chronic) (peripheral): Secondary | ICD-10-CM | POA: Diagnosis not present

## 2019-11-29 DIAGNOSIS — Z86711 Personal history of pulmonary embolism: Secondary | ICD-10-CM | POA: Diagnosis not present

## 2019-11-29 DIAGNOSIS — Z7901 Long term (current) use of anticoagulants: Secondary | ICD-10-CM | POA: Insufficient documentation

## 2019-11-29 DIAGNOSIS — I4821 Permanent atrial fibrillation: Secondary | ICD-10-CM | POA: Diagnosis not present

## 2019-11-29 DIAGNOSIS — E7800 Pure hypercholesterolemia, unspecified: Secondary | ICD-10-CM | POA: Diagnosis not present

## 2019-11-29 DIAGNOSIS — I10 Essential (primary) hypertension: Secondary | ICD-10-CM | POA: Diagnosis not present

## 2019-12-21 DIAGNOSIS — D225 Melanocytic nevi of trunk: Secondary | ICD-10-CM | POA: Diagnosis not present

## 2019-12-21 DIAGNOSIS — D235 Other benign neoplasm of skin of trunk: Secondary | ICD-10-CM | POA: Diagnosis not present

## 2019-12-21 DIAGNOSIS — D485 Neoplasm of uncertain behavior of skin: Secondary | ICD-10-CM | POA: Diagnosis not present

## 2019-12-21 DIAGNOSIS — D2372 Other benign neoplasm of skin of left lower limb, including hip: Secondary | ICD-10-CM | POA: Diagnosis not present

## 2020-01-27 DIAGNOSIS — E785 Hyperlipidemia, unspecified: Secondary | ICD-10-CM | POA: Diagnosis not present

## 2020-01-27 DIAGNOSIS — I10 Essential (primary) hypertension: Secondary | ICD-10-CM | POA: Diagnosis not present

## 2020-02-08 DIAGNOSIS — M949 Disorder of cartilage, unspecified: Secondary | ICD-10-CM | POA: Diagnosis not present

## 2020-02-08 DIAGNOSIS — Z79899 Other long term (current) drug therapy: Secondary | ICD-10-CM | POA: Diagnosis not present

## 2020-02-08 DIAGNOSIS — E7800 Pure hypercholesterolemia, unspecified: Secondary | ICD-10-CM | POA: Diagnosis not present

## 2020-02-27 DIAGNOSIS — E785 Hyperlipidemia, unspecified: Secondary | ICD-10-CM | POA: Diagnosis not present

## 2020-02-27 DIAGNOSIS — I10 Essential (primary) hypertension: Secondary | ICD-10-CM | POA: Diagnosis not present

## 2020-03-25 DIAGNOSIS — Z6841 Body Mass Index (BMI) 40.0 and over, adult: Secondary | ICD-10-CM | POA: Diagnosis not present

## 2020-03-25 DIAGNOSIS — Z125 Encounter for screening for malignant neoplasm of prostate: Secondary | ICD-10-CM | POA: Diagnosis not present

## 2020-03-25 DIAGNOSIS — Z0000 Encounter for general adult medical examination without abnormal findings: Secondary | ICD-10-CM | POA: Diagnosis not present

## 2020-03-25 DIAGNOSIS — Z79899 Other long term (current) drug therapy: Secondary | ICD-10-CM | POA: Diagnosis not present

## 2020-03-25 DIAGNOSIS — Z23 Encounter for immunization: Secondary | ICD-10-CM | POA: Diagnosis not present

## 2020-03-28 DIAGNOSIS — E785 Hyperlipidemia, unspecified: Secondary | ICD-10-CM | POA: Diagnosis not present

## 2020-03-28 DIAGNOSIS — I10 Essential (primary) hypertension: Secondary | ICD-10-CM | POA: Diagnosis not present

## 2020-04-27 DIAGNOSIS — I10 Essential (primary) hypertension: Secondary | ICD-10-CM | POA: Diagnosis not present

## 2020-04-27 DIAGNOSIS — E785 Hyperlipidemia, unspecified: Secondary | ICD-10-CM | POA: Diagnosis not present

## 2020-05-28 DIAGNOSIS — E785 Hyperlipidemia, unspecified: Secondary | ICD-10-CM | POA: Diagnosis not present

## 2020-05-28 DIAGNOSIS — I10 Essential (primary) hypertension: Secondary | ICD-10-CM | POA: Diagnosis not present

## 2020-05-30 DIAGNOSIS — H52223 Regular astigmatism, bilateral: Secondary | ICD-10-CM | POA: Diagnosis not present

## 2020-05-30 DIAGNOSIS — H25011 Cortical age-related cataract, right eye: Secondary | ICD-10-CM | POA: Diagnosis not present

## 2020-05-30 DIAGNOSIS — D3131 Benign neoplasm of right choroid: Secondary | ICD-10-CM | POA: Diagnosis not present

## 2020-05-30 DIAGNOSIS — H2513 Age-related nuclear cataract, bilateral: Secondary | ICD-10-CM | POA: Diagnosis not present

## 2020-05-30 DIAGNOSIS — H35361 Drusen (degenerative) of macula, right eye: Secondary | ICD-10-CM | POA: Diagnosis not present

## 2020-05-30 DIAGNOSIS — H16212 Exposure keratoconjunctivitis, left eye: Secondary | ICD-10-CM | POA: Diagnosis not present

## 2020-05-30 DIAGNOSIS — H25043 Posterior subcapsular polar age-related cataract, bilateral: Secondary | ICD-10-CM | POA: Diagnosis not present

## 2020-05-30 DIAGNOSIS — H5203 Hypermetropia, bilateral: Secondary | ICD-10-CM | POA: Diagnosis not present

## 2020-05-30 DIAGNOSIS — H524 Presbyopia: Secondary | ICD-10-CM | POA: Diagnosis not present

## 2020-06-20 DIAGNOSIS — L821 Other seborrheic keratosis: Secondary | ICD-10-CM | POA: Diagnosis not present

## 2020-06-20 DIAGNOSIS — L578 Other skin changes due to chronic exposure to nonionizing radiation: Secondary | ICD-10-CM | POA: Diagnosis not present

## 2020-06-20 DIAGNOSIS — I872 Venous insufficiency (chronic) (peripheral): Secondary | ICD-10-CM | POA: Diagnosis not present

## 2020-06-20 DIAGNOSIS — D1801 Hemangioma of skin and subcutaneous tissue: Secondary | ICD-10-CM | POA: Diagnosis not present

## 2020-06-20 DIAGNOSIS — L918 Other hypertrophic disorders of the skin: Secondary | ICD-10-CM | POA: Diagnosis not present

## 2020-06-20 DIAGNOSIS — D235 Other benign neoplasm of skin of trunk: Secondary | ICD-10-CM | POA: Diagnosis not present

## 2020-06-20 DIAGNOSIS — L814 Other melanin hyperpigmentation: Secondary | ICD-10-CM | POA: Diagnosis not present

## 2020-06-20 DIAGNOSIS — D229 Melanocytic nevi, unspecified: Secondary | ICD-10-CM | POA: Diagnosis not present

## 2020-06-20 DIAGNOSIS — X32XXXS Exposure to sunlight, sequela: Secondary | ICD-10-CM | POA: Diagnosis not present

## 2020-06-27 DIAGNOSIS — I10 Essential (primary) hypertension: Secondary | ICD-10-CM | POA: Diagnosis not present

## 2020-06-27 DIAGNOSIS — N400 Enlarged prostate without lower urinary tract symptoms: Secondary | ICD-10-CM | POA: Diagnosis not present

## 2020-06-27 DIAGNOSIS — E7800 Pure hypercholesterolemia, unspecified: Secondary | ICD-10-CM | POA: Diagnosis not present

## 2020-07-28 DIAGNOSIS — I10 Essential (primary) hypertension: Secondary | ICD-10-CM | POA: Diagnosis not present

## 2020-07-28 DIAGNOSIS — E785 Hyperlipidemia, unspecified: Secondary | ICD-10-CM | POA: Diagnosis not present

## 2020-08-26 DIAGNOSIS — E785 Hyperlipidemia, unspecified: Secondary | ICD-10-CM | POA: Diagnosis not present

## 2020-08-26 DIAGNOSIS — I10 Essential (primary) hypertension: Secondary | ICD-10-CM | POA: Diagnosis not present

## 2020-09-19 DIAGNOSIS — Z6841 Body Mass Index (BMI) 40.0 and over, adult: Secondary | ICD-10-CM | POA: Diagnosis not present

## 2020-09-19 DIAGNOSIS — E7800 Pure hypercholesterolemia, unspecified: Secondary | ICD-10-CM | POA: Diagnosis not present

## 2020-09-19 DIAGNOSIS — Z1331 Encounter for screening for depression: Secondary | ICD-10-CM | POA: Diagnosis not present

## 2020-09-19 DIAGNOSIS — I10 Essential (primary) hypertension: Secondary | ICD-10-CM | POA: Diagnosis not present

## 2020-09-25 DIAGNOSIS — I10 Essential (primary) hypertension: Secondary | ICD-10-CM | POA: Diagnosis not present

## 2020-09-25 DIAGNOSIS — E785 Hyperlipidemia, unspecified: Secondary | ICD-10-CM | POA: Diagnosis not present

## 2020-11-25 DIAGNOSIS — E785 Hyperlipidemia, unspecified: Secondary | ICD-10-CM | POA: Diagnosis not present

## 2020-11-25 DIAGNOSIS — I10 Essential (primary) hypertension: Secondary | ICD-10-CM | POA: Diagnosis not present

## 2020-12-16 DIAGNOSIS — Z6841 Body Mass Index (BMI) 40.0 and over, adult: Secondary | ICD-10-CM | POA: Diagnosis not present

## 2020-12-16 DIAGNOSIS — Z86711 Personal history of pulmonary embolism: Secondary | ICD-10-CM | POA: Diagnosis not present

## 2020-12-16 DIAGNOSIS — Z7901 Long term (current) use of anticoagulants: Secondary | ICD-10-CM | POA: Diagnosis not present

## 2020-12-16 DIAGNOSIS — I10 Essential (primary) hypertension: Secondary | ICD-10-CM | POA: Diagnosis not present

## 2020-12-16 DIAGNOSIS — E7800 Pure hypercholesterolemia, unspecified: Secondary | ICD-10-CM | POA: Diagnosis not present

## 2020-12-16 DIAGNOSIS — I482 Chronic atrial fibrillation, unspecified: Secondary | ICD-10-CM | POA: Diagnosis not present

## 2020-12-16 DIAGNOSIS — I872 Venous insufficiency (chronic) (peripheral): Secondary | ICD-10-CM | POA: Diagnosis not present

## 2020-12-17 DIAGNOSIS — I4891 Unspecified atrial fibrillation: Secondary | ICD-10-CM | POA: Diagnosis not present

## 2020-12-26 DIAGNOSIS — I10 Essential (primary) hypertension: Secondary | ICD-10-CM | POA: Diagnosis not present

## 2020-12-26 DIAGNOSIS — E785 Hyperlipidemia, unspecified: Secondary | ICD-10-CM | POA: Diagnosis not present

## 2021-01-09 DIAGNOSIS — D229 Melanocytic nevi, unspecified: Secondary | ICD-10-CM | POA: Diagnosis not present

## 2021-01-09 DIAGNOSIS — D2362 Other benign neoplasm of skin of left upper limb, including shoulder: Secondary | ICD-10-CM | POA: Diagnosis not present

## 2021-01-09 DIAGNOSIS — D1801 Hemangioma of skin and subcutaneous tissue: Secondary | ICD-10-CM | POA: Diagnosis not present

## 2021-01-09 DIAGNOSIS — D2372 Other benign neoplasm of skin of left lower limb, including hip: Secondary | ICD-10-CM | POA: Diagnosis not present

## 2021-01-09 DIAGNOSIS — L821 Other seborrheic keratosis: Secondary | ICD-10-CM | POA: Diagnosis not present

## 2021-01-09 DIAGNOSIS — D485 Neoplasm of uncertain behavior of skin: Secondary | ICD-10-CM | POA: Diagnosis not present

## 2021-01-09 DIAGNOSIS — L570 Actinic keratosis: Secondary | ICD-10-CM | POA: Diagnosis not present

## 2021-01-09 DIAGNOSIS — L578 Other skin changes due to chronic exposure to nonionizing radiation: Secondary | ICD-10-CM | POA: Diagnosis not present

## 2021-01-26 DIAGNOSIS — I10 Essential (primary) hypertension: Secondary | ICD-10-CM | POA: Diagnosis not present

## 2021-01-26 DIAGNOSIS — E785 Hyperlipidemia, unspecified: Secondary | ICD-10-CM | POA: Diagnosis not present

## 2021-02-26 DIAGNOSIS — I10 Essential (primary) hypertension: Secondary | ICD-10-CM | POA: Diagnosis not present

## 2021-02-26 DIAGNOSIS — E785 Hyperlipidemia, unspecified: Secondary | ICD-10-CM | POA: Diagnosis not present

## 2021-03-19 DIAGNOSIS — K649 Unspecified hemorrhoids: Secondary | ICD-10-CM | POA: Diagnosis not present

## 2021-03-19 DIAGNOSIS — Z7901 Long term (current) use of anticoagulants: Secondary | ICD-10-CM | POA: Diagnosis not present

## 2021-03-19 DIAGNOSIS — R160 Hepatomegaly, not elsewhere classified: Secondary | ICD-10-CM | POA: Diagnosis not present

## 2021-03-25 DIAGNOSIS — R161 Splenomegaly, not elsewhere classified: Secondary | ICD-10-CM | POA: Diagnosis not present

## 2021-03-25 DIAGNOSIS — K8020 Calculus of gallbladder without cholecystitis without obstruction: Secondary | ICD-10-CM | POA: Diagnosis not present

## 2021-03-25 DIAGNOSIS — R160 Hepatomegaly, not elsewhere classified: Secondary | ICD-10-CM | POA: Diagnosis not present

## 2021-03-25 DIAGNOSIS — N2889 Other specified disorders of kidney and ureter: Secondary | ICD-10-CM | POA: Diagnosis not present

## 2021-03-26 DIAGNOSIS — N2889 Other specified disorders of kidney and ureter: Secondary | ICD-10-CM | POA: Diagnosis not present

## 2021-03-26 DIAGNOSIS — Z7901 Long term (current) use of anticoagulants: Secondary | ICD-10-CM | POA: Diagnosis not present

## 2021-03-26 DIAGNOSIS — K649 Unspecified hemorrhoids: Secondary | ICD-10-CM | POA: Diagnosis not present

## 2021-03-26 DIAGNOSIS — R160 Hepatomegaly, not elsewhere classified: Secondary | ICD-10-CM | POA: Diagnosis not present

## 2021-03-31 DIAGNOSIS — N2889 Other specified disorders of kidney and ureter: Secondary | ICD-10-CM | POA: Diagnosis not present

## 2021-03-31 DIAGNOSIS — R970 Elevated carcinoembryonic antigen [CEA]: Secondary | ICD-10-CM | POA: Diagnosis not present

## 2021-03-31 DIAGNOSIS — R160 Hepatomegaly, not elsewhere classified: Secondary | ICD-10-CM | POA: Diagnosis not present

## 2021-03-31 DIAGNOSIS — R935 Abnormal findings on diagnostic imaging of other abdominal regions, including retroperitoneum: Secondary | ICD-10-CM | POA: Diagnosis not present

## 2021-03-31 DIAGNOSIS — D490 Neoplasm of unspecified behavior of digestive system: Secondary | ICD-10-CM | POA: Diagnosis not present

## 2021-04-03 DIAGNOSIS — Z6841 Body Mass Index (BMI) 40.0 and over, adult: Secondary | ICD-10-CM | POA: Diagnosis not present

## 2021-04-03 DIAGNOSIS — Z79899 Other long term (current) drug therapy: Secondary | ICD-10-CM | POA: Diagnosis not present

## 2021-04-03 DIAGNOSIS — Z23 Encounter for immunization: Secondary | ICD-10-CM | POA: Diagnosis not present

## 2021-04-03 DIAGNOSIS — E785 Hyperlipidemia, unspecified: Secondary | ICD-10-CM | POA: Diagnosis not present

## 2021-04-03 DIAGNOSIS — Z0000 Encounter for general adult medical examination without abnormal findings: Secondary | ICD-10-CM | POA: Diagnosis not present

## 2021-04-04 DIAGNOSIS — R160 Hepatomegaly, not elsewhere classified: Secondary | ICD-10-CM | POA: Diagnosis not present

## 2021-04-04 DIAGNOSIS — N281 Cyst of kidney, acquired: Secondary | ICD-10-CM | POA: Diagnosis not present

## 2021-04-04 DIAGNOSIS — N200 Calculus of kidney: Secondary | ICD-10-CM | POA: Diagnosis not present

## 2021-04-04 DIAGNOSIS — K449 Diaphragmatic hernia without obstruction or gangrene: Secondary | ICD-10-CM | POA: Diagnosis not present

## 2021-04-04 DIAGNOSIS — K7689 Other specified diseases of liver: Secondary | ICD-10-CM | POA: Diagnosis not present

## 2021-04-04 DIAGNOSIS — E882 Lipomatosis, not elsewhere classified: Secondary | ICD-10-CM | POA: Diagnosis not present

## 2021-04-04 DIAGNOSIS — N2889 Other specified disorders of kidney and ureter: Secondary | ICD-10-CM | POA: Diagnosis not present

## 2021-04-10 DIAGNOSIS — Z7901 Long term (current) use of anticoagulants: Secondary | ICD-10-CM | POA: Diagnosis not present

## 2021-04-10 DIAGNOSIS — R160 Hepatomegaly, not elsewhere classified: Secondary | ICD-10-CM | POA: Diagnosis not present

## 2021-04-15 DIAGNOSIS — R160 Hepatomegaly, not elsewhere classified: Secondary | ICD-10-CM | POA: Diagnosis not present

## 2021-04-15 DIAGNOSIS — I998 Other disorder of circulatory system: Secondary | ICD-10-CM | POA: Diagnosis not present

## 2021-04-15 DIAGNOSIS — K7689 Other specified diseases of liver: Secondary | ICD-10-CM | POA: Diagnosis not present

## 2021-04-24 DIAGNOSIS — R3915 Urgency of urination: Secondary | ICD-10-CM | POA: Diagnosis not present

## 2021-04-24 DIAGNOSIS — R3912 Poor urinary stream: Secondary | ICD-10-CM | POA: Diagnosis not present

## 2021-04-24 DIAGNOSIS — N401 Enlarged prostate with lower urinary tract symptoms: Secondary | ICD-10-CM | POA: Diagnosis not present

## 2021-04-24 DIAGNOSIS — D4102 Neoplasm of uncertain behavior of left kidney: Secondary | ICD-10-CM | POA: Diagnosis not present

## 2021-04-24 DIAGNOSIS — D4101 Neoplasm of uncertain behavior of right kidney: Secondary | ICD-10-CM | POA: Diagnosis not present

## 2021-04-28 DIAGNOSIS — I10 Essential (primary) hypertension: Secondary | ICD-10-CM | POA: Diagnosis not present

## 2021-04-28 DIAGNOSIS — E785 Hyperlipidemia, unspecified: Secondary | ICD-10-CM | POA: Diagnosis not present

## 2021-05-05 DIAGNOSIS — Z85850 Personal history of malignant neoplasm of thyroid: Secondary | ICD-10-CM | POA: Diagnosis not present

## 2021-05-05 DIAGNOSIS — N2889 Other specified disorders of kidney and ureter: Secondary | ICD-10-CM | POA: Diagnosis not present

## 2021-05-05 DIAGNOSIS — I898 Other specified noninfective disorders of lymphatic vessels and lymph nodes: Secondary | ICD-10-CM | POA: Diagnosis not present

## 2021-05-05 DIAGNOSIS — J984 Other disorders of lung: Secondary | ICD-10-CM | POA: Diagnosis not present

## 2021-05-05 DIAGNOSIS — J929 Pleural plaque without asbestos: Secondary | ICD-10-CM | POA: Diagnosis not present

## 2021-05-05 DIAGNOSIS — R918 Other nonspecific abnormal finding of lung field: Secondary | ICD-10-CM | POA: Diagnosis not present

## 2021-05-05 DIAGNOSIS — C799 Secondary malignant neoplasm of unspecified site: Secondary | ICD-10-CM | POA: Diagnosis not present

## 2021-05-05 DIAGNOSIS — K769 Liver disease, unspecified: Secondary | ICD-10-CM | POA: Diagnosis not present

## 2021-05-12 DIAGNOSIS — Z6841 Body Mass Index (BMI) 40.0 and over, adult: Secondary | ICD-10-CM | POA: Diagnosis not present

## 2021-05-12 DIAGNOSIS — I10 Essential (primary) hypertension: Secondary | ICD-10-CM | POA: Diagnosis not present

## 2021-05-12 DIAGNOSIS — D49519 Neoplasm of unspecified behavior of unspecified kidney: Secondary | ICD-10-CM | POA: Diagnosis not present

## 2021-05-13 ENCOUNTER — Other Ambulatory Visit (HOSPITAL_COMMUNITY): Payer: Self-pay | Admitting: Family Medicine

## 2021-05-13 ENCOUNTER — Other Ambulatory Visit: Payer: Self-pay | Admitting: Family Medicine

## 2021-05-13 DIAGNOSIS — K769 Liver disease, unspecified: Secondary | ICD-10-CM

## 2021-05-13 DIAGNOSIS — D49519 Neoplasm of unspecified behavior of unspecified kidney: Secondary | ICD-10-CM

## 2021-05-23 NOTE — Progress Notes (Incomplete)
Floy Sabina Legal Sex  Male DOB  August 04, 1950 SSN  [id number] Address  [place] [address] Phone  [phone] [facility])  [phone] (Work)  [phone] (Mobile)    RE: US BIOPSY (LIVER) Received: 1 week ago Sandi Mariscal, MD  [name] [place] for CT guided renal mass Bx.   CT AP - 05/06[birth year]51 - several lesions bilaterally but would probably target the left sided lesion on image 88, series 2.   (Note I did an US guided Bx of the liver lesion at [facility] but it ended up just being a hemangioma).   Cathren Harsh        Previous Messages   ----- Message -----  From: [name]  Sent: 05/16/2021   4:07 PM EST  To: Sandi Mariscal, MD  Subject: RE: US BIOPSY (LIVER)                           See review from [place] below, I contacted the office back  ----- Message -----  From: Suzette Battiest, MD  Sent: 05/14/2021   9:18 AM EST  To: [name]  Subject: RE: US BIOPSY (LIVER)                           Please provide reason for repeat biopsy.   Thanks,  [name]  ----- Message -----  From: [name]  Sent: 05/13/2021   6:54 PM EST  To: [facility] Procedure Requests  Subject: US BIOPSY (LIVER)                               US BIOPSY (LIVER)    Reason: Liver lesion      History: CT and MRI  done at [facility]  both in computer, PT had previous Biopsy done at [facility] as well .     Provider: [name]

## 2021-05-23 NOTE — Progress Notes (Deleted)
James Schaefer Legal Sex  Male DOB  August 24, 1950 SSN  [id number] Address  [place] [address] Phone  [phone] [facility])  [phone] (Work)  [phone] (Mobile)    RE: US BIOPSY (LIVER) Received: 1 week ago Sandi Mariscal, MD  [name] [place] for CT guided renal mass Bx.   CT AP - 05/06/51 - several lesions bilaterally but would probably target the left sided lesion on image 88, series 2.   (Note I did an US guided Bx of the liver lesion at [facility] but it ended up just being a hemangioma).   Cathren Harsh        Previous Messages   ----- Message -----  From: [name]  Sent: 05/16/2021   4:07 PM EST  To: Sandi Mariscal, MD  Subject: RE: US BIOPSY (LIVER)                           See review from [place] below, I contacted the office back  ----- Message -----  From: Suzette Battiest, MD  Sent: 05/14/2021   9:18 AM EST  To: [name]  Subject: RE: US BIOPSY (LIVER)                           Please provide reason for repeat biopsy.   Thanks,  [name]  ----- Message -----  From: [name]  Sent: 05/13/2021   6:54 PM EST  To: Ir Procedure Requests  Subject: US BIOPSY (LIVER)                               US BIOPSY (LIVER)    Reason: Liver lesion      History: CT and MRI  done at [facility]  both in computer, PT had previous Biopsy done at [place] as well .     Provider: [name]

## 2021-05-29 ENCOUNTER — Other Ambulatory Visit: Payer: Self-pay | Admitting: Radiology

## 2021-05-30 ENCOUNTER — Ambulatory Visit (HOSPITAL_COMMUNITY)
Admission: RE | Admit: 2021-05-30 | Discharge: 2021-05-30 | Disposition: A | Discharge status: Home / Self Care | Payer: Medicare HMO | Source: Ambulatory Visit | Attending: Family Medicine | Admitting: Family Medicine

## 2021-05-30 ENCOUNTER — Other Ambulatory Visit: Payer: Self-pay

## 2021-05-30 DIAGNOSIS — K769 Liver disease, unspecified: Secondary | ICD-10-CM | POA: Diagnosis not present

## 2021-05-30 DIAGNOSIS — D49519 Neoplasm of unspecified behavior of unspecified kidney: Secondary | ICD-10-CM | POA: Insufficient documentation

## 2021-05-30 LAB — CBC
HCT: 47.3 % (ref 39.0–52.0)
Hemoglobin: 15.8 g/dL (ref 13.0–17.0)
MCH: 31.8 pg (ref 26.0–34.0)
MCHC: 33.4 g/dL (ref 30.0–36.0)
MCV: 95.2 fL (ref 80.0–100.0)
Platelets: 216 10*3/uL (ref 150–400)
RBC: 4.97 MIL/uL (ref 4.22–5.81)
RDW: 12.8 % (ref 11.5–15.5)
WBC: 7 10*3/uL (ref 4.0–10.5)
nRBC: 0 % (ref 0.0–0.2)

## 2021-05-30 LAB — PROTIME-INR
INR: 1 (ref 0.8–1.2)
Prothrombin Time: 13.6 seconds (ref 11.4–15.2)

## 2021-05-30 MED ORDER — LISINOPRIL 10 MG PO TABS: 10.0000 mg | ORAL_TABLET | Freq: Once | ORAL | Status: DC

## 2021-05-30 MED ORDER — SODIUM CHLORIDE 0.9 % IV SOLN: INTRAVENOUS | Status: DC

## 2021-05-30 MED ORDER — HYDRALAZINE HCL 20 MG/ML IJ SOLN: 10.0000 mg | Freq: Once | INTRAMUSCULAR | Status: DC

## 2021-05-30 NOTE — H&P (Signed)
Chief Complaint: Patient was seen in consultation today for left renal lesion biopsy.   Referring Physician(s): Redding,John F. II  Supervising Physician: Markus Daft  Patient Status: [facility] - Out-pt  History of Present Illness: James Schaefer is a 70 y.o. male with a past medical history significant for HTN, HLD, chronic venous insufficiency, chronic a.fib on Xarelto, PE/DVT (2015) s/p IVC filter placement and liver/renal lesions of unknown etiology who presents today for a left renal lesion biopsy. James Schaefer was found to have multiple lesions on both kidneys and within his liver on CT abd/pelvis and on follow up MRI. He underwent a liver lesion biopsy at [facility] and pathology showed this to be a hemangioma. IR has been asked to perform a biopsy of the renal lesions to further direct care.  Past Medical History:  Diagnosis Date   Hypercholesteremia    Hypertension     Past Surgical History:  Procedure Laterality Date   HERNIA REPAIR     INTRAMEDULLARY (IM) NAIL INTERTROCHANTERIC Right 09/04/2013   Procedure: INTRAMEDULLARY (IM) NAIL INTERTROCHANTRIC;  Surgeon: [name], MD;  Location: [place];  Service: Orthopedics;  Laterality: Right;   knee replacements     penile pump      Allergies: Patient has no known allergies.  Medications: Prior to Admission medications   Medication Sig Start Date End Date Taking? Authorizing Provider  acetaminophen (TYLENOL) 325 MG tablet Take 2 tablets (650 mg total) by mouth every 6 (six) hours as needed for mild pain (or Fever >/= 101). 09/08/13   [name], MD  lisinopril (PRINIVIL,ZESTRIL) 10 MG tablet Take 1 tablet (10 mg total) by mouth every evening. 09/08/13   [name], MD  methocarbamol (ROBAXIN) 500 MG tablet Take 1 tablet (500 mg total) by mouth every 6 (six) hours as needed for muscle spasms. 09/04/13   Chadwell, [name], PA-C  oxyCODONE-acetaminophen (ROXICET) 5-325 MG per tablet Take 1-2 tablets by  mouth every 4 (four) hours as needed for severe pain. 09/04/13   Chadwell, [name], PA-C  pravastatin (PRAVACHOL) 40 MG tablet Take 40 mg by mouth every evening.    [provider]  promethazine (PHENERGAN) 12.5 MG tablet Take 1 tablet (12.5 mg total) by mouth every 6 (six) hours as needed for nausea or vomiting. 09/08/13   [name], MD  vitamin B-12 (CYANOCOBALAMIN) 1000 MCG tablet Take 1,000 mcg by mouth every evening.    [provider]  warfarin (COUMADIN) 5 MG tablet Take 1 tablet (5 mg total) by mouth daily. TO BE ADJUSTED ACCORDING TO INR GOAL RANGE 2-3.  Total time for coumadin 4 weeks post op, d/c on 10/02/13. 09/08/13   Chriss Czar, PA-C     Family History  Problem Relation Age of Onset   CAD Father    Kidney failure Father    Pancreatic cancer Maternal Uncle     Social History   Socioeconomic History   Marital status: Married    Spouse name: Not on file   Number of children: Not on file   Years of education: Not on file   Highest education level: Not on file  Occupational History   Not on file  Tobacco Use   Smoking status: Former    Types: Cigarettes    Quit date: 09/04/1978    Years since quitting: 42.7   Smokeless tobacco: Former    Quit date: 09/04/1978  Substance and Sexual Activity   Alcohol use: Yes    Comment: occassional  Drug use: No   Sexual activity: Not on file  Other Topics Concern   Not on file  Social History Narrative   Not on file   Social Determinants of Health   Financial Resource Strain: Not on file  Food Insecurity: Not on file  Transportation Needs: Not on file  Physical Activity: Not on file  Stress: Not on file  Social Connections: Not on file     Review of Systems: A 12 point ROS discussed and pertinent positives are indicated in the HPI above.  All other systems are negative.  Review of Systems  Constitutional:  Negative for chills and fever.  Respiratory:  Negative for cough and shortness of breath.    Cardiovascular:  Negative for chest pain.  Gastrointestinal:  Negative for abdominal pain, nausea and vomiting.  Musculoskeletal:  Negative for back pain.  Neurological:  Negative for headaches.   Vital Signs: BP (!) 152/96   Pulse 81   Temp 98.1 F (36.7 C) (Oral)   Resp 18   Ht 6\' 4"  (1.93 m)   Wt (!) 335 lb (152 kg)   SpO2 99%   BMI 40.78 kg/m   Physical Exam Vitals reviewed.  Constitutional:      General: He is not in acute distress.    Appearance: He is obese.  HENT:     Head: Normocephalic.     Mouth/Throat:     Mouth: Mucous membranes are moist.     Pharynx: Oropharynx is clear. No oropharyngeal exudate or posterior oropharyngeal erythema.  Cardiovascular:     Rate and Rhythm: Normal rate and regular rhythm.  Pulmonary:     Effort: Pulmonary effort is normal.     Breath sounds: Normal breath sounds.  Abdominal:     General: There is no distension.     Palpations: Abdomen is soft.     Tenderness: There is no abdominal tenderness.  Skin:    General: Skin is warm and dry.  Neurological:     Mental Status: He is alert and oriented to person, place, and time.  Psychiatric:        Mood and Affect: Mood normal.        Behavior: Behavior normal.        Thought Content: Thought content normal.        Judgment: Judgment normal.     MD Evaluation Airway: WNL Heart: WNL Abdomen: WNL Chest/ Lungs: WNL ASA  Classification: 2 Mallampati/Airway Score: Two   Imaging: No results found.  Labs:  CBC: Recent Labs    05/30/21 0945  WBC 7.0  HGB 15.8  HCT 47.3  PLT 216    COAGS: No results for input(s): INR, APTT in the last 8760 hours.  BMP: No results for input(s): NA, K, CL, CO2, GLUCOSE, BUN, CALCIUM, CREATININE, GFRNONAA, GFRAA in the last 8760 hours.  Invalid input(s): CMP  LIVER FUNCTION TESTS: No results for input(s): BILITOT, AST, ALT, ALKPHOS, PROT, ALBUMIN in the last 8760 hours.  TUMOR MARKERS: No results for input(s): AFPTM, CEA,  CA199, CHROMGRNA in the last 8760 hours.  Assessment and Plan:  70 y/o M with history of multiple liver/renal lesions s/p liver lesion biopsy in IR at [facility], pathology showed this to be a hemangioma, request has been made for renal lesion biopsy for further evaluation.   Risks and benefits of left renal lesion biopsy was discussed with the patient and/or patient's family including, but not limited to bleeding, infection, damage to adjacent structures or low  yield requiring additional tests.  All of the questions were answered and there is agreement to proceed.  Consent signed and in chart.   Thank you for this interesting consult.  I greatly enjoyed meeting James Schaefer and look forward to participating in their care.  A copy of this report was sent to the requesting provider on this date.  Electronically Signed: Joaquim Nam, PA-C 05/30/2021, 9:15 AM   I spent a total of 30 Minutes  in face to face in clinical consultation, greater than 50% of which was counseling/coordinating care for left renal lesion biopsy.

## 2021-05-30 NOTE — Sedation Documentation (Signed)
Dr. Anselm Pancoast at bedside to discuss plan of care with pt and wife. Biopsy canceled today d/t hypertension. To be rescheduled.

## 2021-06-02 ENCOUNTER — Ambulatory Visit (HOSPITAL_COMMUNITY)
Admission: RE | Admit: 2021-06-02 | Discharge: 2021-06-02 | Disposition: A | Discharge status: Home / Self Care | Payer: Medicare HMO | Source: Ambulatory Visit | Attending: Family Medicine | Admitting: Family Medicine

## 2021-06-02 ENCOUNTER — Other Ambulatory Visit (HOSPITAL_COMMUNITY): Payer: Self-pay | Admitting: Family Medicine

## 2021-06-02 ENCOUNTER — Other Ambulatory Visit: Payer: Self-pay

## 2021-06-02 DIAGNOSIS — I10 Essential (primary) hypertension: Secondary | ICD-10-CM | POA: Diagnosis not present

## 2021-06-02 DIAGNOSIS — I482 Chronic atrial fibrillation, unspecified: Secondary | ICD-10-CM | POA: Insufficient documentation

## 2021-06-02 DIAGNOSIS — D49519 Neoplasm of unspecified behavior of unspecified kidney: Secondary | ICD-10-CM | POA: Diagnosis present

## 2021-06-02 DIAGNOSIS — Z7901 Long term (current) use of anticoagulants: Secondary | ICD-10-CM | POA: Insufficient documentation

## 2021-06-02 DIAGNOSIS — E785 Hyperlipidemia, unspecified: Secondary | ICD-10-CM | POA: Diagnosis not present

## 2021-06-02 DIAGNOSIS — I872 Venous insufficiency (chronic) (peripheral): Secondary | ICD-10-CM | POA: Diagnosis not present

## 2021-06-02 DIAGNOSIS — D1809 Hemangioma of other sites: Secondary | ICD-10-CM | POA: Insufficient documentation

## 2021-06-02 DIAGNOSIS — N2889 Other specified disorders of kidney and ureter: Secondary | ICD-10-CM | POA: Insufficient documentation

## 2021-06-02 DIAGNOSIS — D3002 Benign neoplasm of left kidney: Secondary | ICD-10-CM | POA: Diagnosis not present

## 2021-06-02 DIAGNOSIS — K769 Liver disease, unspecified: Secondary | ICD-10-CM | POA: Insufficient documentation

## 2021-06-02 DIAGNOSIS — Z86718 Personal history of other venous thrombosis and embolism: Secondary | ICD-10-CM | POA: Insufficient documentation

## 2021-06-02 DIAGNOSIS — D4102 Neoplasm of uncertain behavior of left kidney: Secondary | ICD-10-CM | POA: Diagnosis not present

## 2021-06-02 DIAGNOSIS — Z87891 Personal history of nicotine dependence: Secondary | ICD-10-CM | POA: Insufficient documentation

## 2021-06-02 LAB — CBC
HCT: 47.1 % (ref 39.0–52.0)
Hemoglobin: 16.2 g/dL (ref 13.0–17.0)
MCH: 32.3 pg (ref 26.0–34.0)
MCHC: 34.4 g/dL (ref 30.0–36.0)
MCV: 93.8 fL (ref 80.0–100.0)
Platelets: 192 10*3/uL (ref 150–400)
RBC: 5.02 MIL/uL (ref 4.22–5.81)
RDW: 12.9 % (ref 11.5–15.5)
WBC: 7.3 10*3/uL (ref 4.0–10.5)
nRBC: 0 % (ref 0.0–0.2)

## 2021-06-02 LAB — APTT: aPTT: 34 seconds (ref 24–36)

## 2021-06-02 LAB — PROTIME-INR
INR: 1 (ref 0.8–1.2)
Prothrombin Time: 13.3 seconds (ref 11.4–15.2)

## 2021-06-02 MED ORDER — MIDAZOLAM HCL 2 MG/2ML IJ SOLN
INTRAMUSCULAR | Status: AC
  Filled 2021-06-02: qty 2

## 2021-06-02 MED ORDER — LIDOCAINE HCL 1 % IJ SOLN
INTRAMUSCULAR | Status: AC
  Filled 2021-06-02: qty 10

## 2021-06-02 MED ORDER — MIDAZOLAM HCL 2 MG/2ML IJ SOLN
INTRAMUSCULAR | Status: AC | PRN
  Administered 2021-06-02: .5 mg via INTRAVENOUS
  Administered 2021-06-02: 1 mg via INTRAVENOUS

## 2021-06-02 MED ORDER — FENTANYL CITRATE (PF) 100 MCG/2ML IJ SOLN
INTRAMUSCULAR | Status: AC
  Filled 2021-06-02: qty 2

## 2021-06-02 MED ORDER — GELATIN ABSORBABLE 12-7 MM EX MISC
CUTANEOUS | Status: AC
  Filled 2021-06-02: qty 1

## 2021-06-02 MED ORDER — SODIUM CHLORIDE 0.9 % IV SOLN: INTRAVENOUS | Status: DC

## 2021-06-02 MED ORDER — FENTANYL CITRATE (PF) 100 MCG/2ML IJ SOLN
INTRAMUSCULAR | Status: AC | PRN
  Administered 2021-06-02: 25 ug via INTRAVENOUS
  Administered 2021-06-02: 50 ug via INTRAVENOUS

## 2021-06-02 MED ORDER — HYDRALAZINE HCL 20 MG/ML IJ SOLN
10.0000 mg | Freq: Once | INTRAMUSCULAR | Status: AC
  Administered 2021-06-02: 10 mg via INTRAVENOUS
  Filled 2021-06-02: qty 1

## 2021-06-02 NOTE — H&P (Signed)
Chief Complaint: Right renal lesion. Request is for right renal lesion biopsy  Referring Physician(s): Redding,John F. II  Supervising Physician: Ruthann Cancer  Patient Status: [facility] - In-pt  History of Present Illness: James Schaefer is a 70 y.o. male outpatient. Former smoker. History significant for HTN, HLD, chronic venous insufficiency, chronic a.fib on Xarelto, PE/DVT (2015) s/p IVC filter placement and liver/renal lesions of unknown etiology who presents today for a left renal lesion biopsy. James Schaefer was found to have multiple lesions on both kidneys and within his liver on CT abd/pelvis and on follow up MRI. He underwent a liver lesion biopsy at [facility] and pathology showed this to be a hemangioma. IR has been asked to perform a biopsy of the renal lesions to further direct care. Patient presented to [facility] on 12.2.22 for renal biopsy however due to the patient's elevated blood pressure the procedure was unable to be performed.  Patient presents for second attempt at right renal lesion biopsy.  Currently without any significant complaints. Patient alert and laying in bed, calm and comfortable. Denies any fevers, headache, chest pain, SOB, cough, abdominal pain, nausea, vomiting or bleeding. Return precautions and treatment recommendations and follow-up discussed with the patient who is agreeable with the plan.    Past Medical History:  Diagnosis Date   Hypercholesteremia    Hypertension     Past Surgical History:  Procedure Laterality Date   HERNIA REPAIR     INTRAMEDULLARY (IM) NAIL INTERTROCHANTERIC Right 09/04/2013   Procedure: INTRAMEDULLARY (IM) NAIL INTERTROCHANTRIC;  Surgeon: [name], MD;  Location: [place];  Service: Orthopedics;  Laterality: Right;   knee replacements     penile pump      Allergies: Patient has no known allergies.  Medications: Prior to Admission medications   Medication Sig Start Date End Date Taking? Authorizing  Provider  acetaminophen (TYLENOL) 325 MG tablet Take 2 tablets (650 mg total) by mouth every 6 (six) hours as needed for mild pain (or Fever >/= 101). 09/08/13   Kara Dies na L, MD  diltiazem [facility]) 180 MG 24 hr capsule Take 180 mg by mouth daily.    [provider]  hydrALAZINE (APRESOLINE) 25 MG tablet Take 25 mg by mouth 3 (three) times daily.    [provider]  lisinopril (PRINIVIL,ZESTRIL) 10 MG tablet Take 1 tablet (10 mg total) by mouth every evening. Patient taking differently: Take 40 mg by mouth every evening. 09/08/13   Kara Dies na L, MD  methocarbamol (ROBAXIN) 500 MG tablet Take 1 tablet (500 mg total) by mouth every 6 (six) hours as needed for muscle spasms. 09/04/13   Chadwell, [name], PA-C  oxyCODONE-acetaminophen (ROXICET) 5-325 MG per tablet Take 1-2 tablets by mouth every 4 (four) hours as needed for severe pain. 09/04/13   Chadwell, [name], PA-C  pravastatin (PRAVACHOL) 40 MG tablet Take 40 mg by mouth every evening.    [provider]  promethazine (PHENERGAN) 12.5 MG tablet Take 1 tablet (12.5 mg total) by mouth every 6 (six) hours as needed for nausea or vomiting. 09/08/13   Kara Dies na L, MD  rivaroxaban (XARELTO) 20 MG TABS tablet Take 20 mg by mouth daily with supper.    [provider]  vitamin B-12 (CYANOCOBALAMIN) 1000 MCG tablet Take 1,000 mcg by mouth every evening.    [provider]  warfarin (COUMADIN) 5 MG tablet Take 1 tablet (5 mg total) by mouth daily. TO BE ADJUSTED ACCORDING TO INR GOAL  RANGE 2-3.  Total time for coumadin 4 weeks post op, d/c on 10/02/13. 09/08/13   Chriss Czar, PA-C     Family History  Problem Relation Age of Onset   CAD Father    Kidney failure Father    Pancreatic cancer Maternal Uncle     Social History   Socioeconomic History   Marital status: Married    Spouse name: Not on file   Number of children: Not on file   Years of education: Not on file   Highest education  level: Not on file  Occupational History   Not on file  Tobacco Use   Smoking status: Former    Types: Cigarettes    Quit date: 09/04/1978    Years since quitting: 42.7   Smokeless tobacco: Former    Quit date: 09/04/1978  Substance and Sexual Activity   Alcohol use: Yes    Comment: occassional   Drug use: No   Sexual activity: Not on file  Other Topics Concern   Not on file  Social History Narrative   Not on file   Social Determinants of Health   Financial Resource Strain: Not on file  Food Insecurity: Not on file  Transportation Needs: Not on file  Physical Activity: Not on file  Stress: Not on file  Social Connections: Not on file     Review of Systems: A 12 point ROS discussed and pertinent positives are indicated in the HPI above.  All other systems are negative.  Review of Systems  Constitutional:  Negative for fever.  HENT:  Negative for congestion.   Respiratory:  Negative for cough and shortness of breath.   Cardiovascular:  Negative for chest pain.  Gastrointestinal:  Negative for abdominal pain.  Neurological:  Negative for headaches.  Psychiatric/Behavioral:  Negative for behavioral problems and confusion.    Vital Signs: BP (!) 163/114 (BP Location: Left Arm)   Pulse 77   Temp 98 F (36.7 C) (Oral)   Ht 6\' 4"  (1.93 m)   Wt (!) 340 lb (154.2 kg)   SpO2 98%   BMI 41.39 kg/m   Physical Exam Vitals and nursing note reviewed.  Constitutional:      Appearance: He is well-developed. He is obese.  HENT:     Head: Normocephalic.  Cardiovascular:     Rate and Rhythm: Normal rate and regular rhythm.     Heart sounds: Normal heart sounds.  Pulmonary:     Effort: Pulmonary effort is normal.     Breath sounds: Normal breath sounds.  Musculoskeletal:        General: Normal range of motion.     Cervical back: Normal range of motion.  Skin:    General: Skin is dry.  Neurological:     Mental Status: He is alert and oriented to person, place, and time.     Imaging: No results found.  Labs:  CBC: Recent Labs    05/30/21 0945  WBC 7.0  HGB 15.8  HCT 47.3  PLT 216    COAGS: Recent Labs    05/30/21 1115  INR 1.0    BMP: No results for input(s): NA, K, CL, CO2, GLUCOSE, BUN, CALCIUM, CREATININE, GFRNONAA, GFRAA in the last 8760 hours.  Invalid input(s): CMP  LIVER FUNCTION TESTS: No results for input(s): BILITOT, AST, ALT, ALKPHOS, PROT, ALBUMIN in the last 8760 hours.    Assessment and Plan:  70 y.o. male outpatient. Former smoker. History significant for HTN, HLD, chronic venous insufficiency, chronic  a.fib on Xarelto, PE/DVT (2015) s/p IVC filter placement and liver/renal lesions of unknown etiology who presents today for a left renal lesion biopsy. James Schaefer was found to have multiple lesions on both kidneys and within his liver on CT abd/pelvis and on follow up MRI. He underwent a liver lesion biopsy at [facility] and pathology showed this to be a hemangioma. IR has been asked to perform a biopsy of the renal lesions to further direct care. Patient presented to [facility] on 12.2.22 for renal biopsy however due to the patient's elevated blood pressure the procedure was unable to be performed.  Patient presents for second attempt at right renal lesion biopsy.  All labs and medications are within acceptable parameters. NKDA. Patient has been NPO since midnight.   Risks and benefits of right renal lesion biopsy was discussed with the patient and/or patient's family including, but not limited to bleeding, infection, damage to adjacent structures or low yield requiring additional tests.  All of the questions were answered and there is agreement to proceed.  Consent signed and in chart.  Thank you for this interesting consult.  I greatly enjoyed meeting James Schaefer and look forward to participating in their care.  A copy of this report was sent to the requesting provider on this date.  Electronically  Signed: Jacqualine Mau, NP 06/02/2021, 7:15 AM   I spent a total of  30 Minutes   in face to face in clinical consultation, greater than 50% of which was counseling/coordinating care for right renal lesion biopsy

## 2021-06-02 NOTE — Procedures (Signed)
Interventional Radiology Procedure Note  Procedure: CT guided left renal mass biopsy  Findings: Please refer to procedural dictation for full description. Left renal mass 18 ga core x 5.  Multiple dry fires with biopsy device.  Samples placed in formalin.  Gelfoam needle track embolization.  Complications: None immediate  Estimated Blood Loss: < 5 ml  Recommendations: Strict 3 hour bedrest. Follow up Pathology results.   Ruthann Cancer, MD Pager: [phone]

## 2021-06-04 DIAGNOSIS — H35361 Drusen (degenerative) of macula, right eye: Secondary | ICD-10-CM | POA: Diagnosis not present

## 2021-06-04 DIAGNOSIS — H5203 Hypermetropia, bilateral: Secondary | ICD-10-CM | POA: Diagnosis not present

## 2021-06-04 DIAGNOSIS — D3131 Benign neoplasm of right choroid: Secondary | ICD-10-CM | POA: Diagnosis not present

## 2021-06-04 DIAGNOSIS — D3132 Benign neoplasm of left choroid: Secondary | ICD-10-CM | POA: Diagnosis not present

## 2021-06-04 DIAGNOSIS — H52223 Regular astigmatism, bilateral: Secondary | ICD-10-CM | POA: Diagnosis not present

## 2021-06-04 DIAGNOSIS — H268 Other specified cataract: Secondary | ICD-10-CM | POA: Diagnosis not present

## 2021-06-04 DIAGNOSIS — H2513 Age-related nuclear cataract, bilateral: Secondary | ICD-10-CM | POA: Diagnosis not present

## 2021-06-04 DIAGNOSIS — H524 Presbyopia: Secondary | ICD-10-CM | POA: Diagnosis not present

## 2021-06-04 LAB — SURGICAL PATHOLOGY

## 2021-06-06 ENCOUNTER — Telehealth: Payer: Self-pay | Admitting: Oncology

## 2021-06-06 NOTE — Telephone Encounter (Signed)
Scheduled appt per 12/9 referral. Pt is aware of appt date and time.

## 2021-07-01 ENCOUNTER — Other Ambulatory Visit: Payer: Self-pay | Admitting: Oncology

## 2021-07-01 DIAGNOSIS — D3002 Benign neoplasm of left kidney: Secondary | ICD-10-CM

## 2021-07-01 NOTE — Progress Notes (Signed)
[facility]  [address] [place],  [place]  [address] [phone]  Clinic Day:  07/02/2021  Referring physician: Angelina Sheriff, MD   HISTORY OF PRESENT ILLNESS:  The patient is a 71 y.o. male who I was asked to consult upon for an oncocytoma of the left kidney.  This work-up initially began after a physical exam by his gastroenterologist was positive for hepatomegaly.  This led to an ultrasound being done, which showed a lesion on his liver.  Furthermore, bilateral masses were seen in his kidneys.  These findings were further confirmed per an abdominal MRI and CT scan.  The biopsy of the suspicious liver lesion came back consistent with a hemangioma.  He then underwent a biopsy of one of his left renal masses, whose pathology revealed oncocytoma.  Of note, this gentleman has a contralateral renal mass in the mid-anterior portion of his right kidney.  He comes in today to go over his renal biopsy results and their implications.  The patient denies having any recent hematuria, dysuria, or changes in urinary habits which ever led him to suspecting any type of renal mass was present.  PAST MEDICAL HISTORY:   Past Medical History:  Diagnosis Date   Hypercholesteremia    Hypertension   Thyroid cancer Atrial fibrillation Trauma-induced DVT and secondary pulmonary embolus  PAST SURGICAL HISTORY:   Past Surgical History:  Procedure Laterality Date   HERNIA REPAIR     INTRAMEDULLARY (IM) NAIL INTERTROCHANTERIC Right 09/04/2013   Procedure: INTRAMEDULLARY (IM) NAIL INTERTROCHANTRIC;  Surgeon: [name], MD;  Location: [facility];  Service: Orthopedics;  Laterality: Right;   knee replacements     penile pump      CURRENT MEDICATIONS:   Current Outpatient Medications  Medication Sig Dispense Refill   acetaminophen (TYLENOL) 325 MG tablet Take 2 tablets (650 mg total) by mouth every 6 (six) hours as needed for mild pain (or Fever >/= 101).      diltiazem (TIAZAC) 180 MG 24 hr capsule Take 180 mg by mouth daily.     hydrALAZINE (APRESOLINE) 25 MG tablet Take 25 mg by mouth 3 (three) times daily.     lisinopril (PRINIVIL,ZESTRIL) 10 MG tablet Take 1 tablet (10 mg total) by mouth every evening. (Patient taking differently: Take 40 mg by mouth every evening.)     methocarbamol (ROBAXIN) 500 MG tablet Take 1 tablet (500 mg total) by mouth every 6 (six) hours as needed for muscle spasms. 60 tablet 0   oxyCODONE-acetaminophen (ROXICET) 5-325 MG per tablet Take 1-2 tablets by mouth every 4 (four) hours as needed for severe pain. 100 tablet 0   pravastatin (PRAVACHOL) 40 MG tablet Take 40 mg by mouth every evening.     promethazine (PHENERGAN) 12.5 MG tablet Take 1 tablet (12.5 mg total) by mouth every 6 (six) hours as needed for nausea or vomiting. 30 tablet 0   rivaroxaban (XARELTO) 20 MG TABS tablet Take 20 mg by mouth daily with supper.     vitamin B-12 (CYANOCOBALAMIN) 1000 MCG tablet Take 1,000 mcg by mouth every evening.     warfarin (COUMADIN) 5 MG tablet Take 1 tablet (5 mg total) by mouth daily. TO BE ADJUSTED ACCORDING TO INR GOAL RANGE 2-3.  Total time for coumadin 4 weeks post op, d/c on 10/02/13. 30 tablet 0   No current facility-administered medications for this visit.    ALLERGIES:   Allergies  Allergen Reactions   Pedi-Pre Tape  Spray [Wound Dressing Adhesive] Rash    FAMILY HISTORY:   Family History  Problem Relation Age of Onset   CAD Father    Kidney failure Father    Pancreatic cancer Maternal Uncle     SOCIAL HISTORY:  The patient was born and raised in [place].  He currently lives in [place] with his wife of 12 years.  He has 2 children and 1 grandchild.  He worked for a [employer] for 33 years.  He smoked a half a pack of cigarettes daily for 15 years before quitting 40+ years ago.  There is no history of alcohol abuse.  REVIEW OF SYSTEMS:  Review of Systems  Constitutional:  Negative for  fatigue, fever and unexpected weight change.  Respiratory:  Positive for shortness of breath. Negative for chest tightness, cough and hemoptysis.   Cardiovascular:  Negative for chest pain and palpitations.  Gastrointestinal:  Negative for abdominal distention, abdominal pain, blood in stool, constipation, diarrhea, nausea and vomiting.  Genitourinary:  Positive for nocturia. Negative for dysuria, frequency and hematuria.   Musculoskeletal:  Positive for arthralgias. Negative for back pain and myalgias.  Skin:  Negative for itching and rash.  Neurological:  Negative for dizziness, headaches and light-headedness.  Psychiatric/Behavioral:  Negative for depression and suicidal ideas. The patient is not nervous/anxious.     PHYSICAL EXAM:  Blood pressure (!) 173/88, pulse 98, temperature 99 F (37.2 C), resp. rate 18, height 6\' 3"  (1.905 m), weight (!) 354 lb 3.2 oz (160.7 kg), SpO2 95 %. Wt Readings from Last 3 Encounters:  07/02/21 (!) 354 lb 3.2 oz (160.7 kg)  06/02/21 (!) 340 lb (154.2 kg)  05/30/21 (!) 335 lb (152 kg)   Body mass index is 44.27 kg/m. Performance status (ECOG): 1 - Symptomatic but completely ambulatory Physical Exam Constitutional:      Appearance: Normal appearance. He is not ill-appearing.  HENT:     Mouth/Throat:     Mouth: Mucous membranes are moist.     Pharynx: Oropharynx is clear. No oropharyngeal exudate or posterior oropharyngeal erythema.  Cardiovascular:     Rate and Rhythm: Normal rate and regular rhythm.     Heart sounds: No murmur heard.   No friction rub. No gallop.  Pulmonary:     Effort: Pulmonary effort is normal. No respiratory distress.     Breath sounds: Normal breath sounds. No wheezing, rhonchi or rales.  Abdominal:     General: Bowel sounds are normal. There is no distension.     Palpations: Abdomen is soft. There is no mass.     Tenderness: There is no abdominal tenderness.  Musculoskeletal:        General: No swelling.     Right  lower leg: No edema.     Left lower leg: No edema.  Lymphadenopathy:     Cervical: No cervical adenopathy.     Upper Body:     Right upper body: No supraclavicular or axillary adenopathy.     Left upper body: No supraclavicular or axillary adenopathy.     Lower Body: No right inguinal adenopathy. No left inguinal adenopathy.  Skin:    General: Skin is warm.     Coloration: Skin is not jaundiced.     Findings: No lesion or rash.  Neurological:     General: No focal deficit present.     Mental Status: He is alert and oriented to person, place, and time. Mental status is at baseline.  Psychiatric:  Mood and Affect: Mood normal.        Behavior: Behavior normal.        Thought Content: Thought content normal.    LABS:   CBC Latest Ref Rng & Units 07/02/2021 06/02/2021 05/30/2021  WBC - 7.9 7.3 7.0  Hemoglobin 13.5 - 17.5 16.0 16.2 15.8  Hematocrit 41 - 53 47 47.1 47.3  Platelets 150 - 399 165 192 216   CMP Latest Ref Rng & Units 07/02/2021 09/08/2013 09/07/2013  Glucose 70 - 99 mg/dL - 112(H) 125(H)  BUN 4 - 21 20 25(H) 27(H)  Creatinine 0.6 - 1.3 0.9 0.87 0.85  Sodium 137 - 147 141 139 139  Potassium 3.4 - 5.3 4.3 3.5(L) 3.7  Chloride 99 - 108 107 96 98  CO2 13 - 22 25(A) 29 30  Calcium 8.7 - 10.7 9.0 8.5 8.6  Total Protein 6.0 - 8.3 g/dL - - -  Total Bilirubin 0.3 - 1.2 mg/dL - - -  Alkaline Phos 25 - 125 74 - -  AST 14 - 40 25 - -  ALT 10 - 40 22 - -   ASSESSMENT & PLAN:  A 71 y.o. male who I was asked to consult upon for a left renal oncocytoma.  In clinic today, I explained to the patient and his wife that an oncocytoma is usually not considered a malignant neoplasm.  It can potentially grow over time, but it rarely, if ever, metastasizes.  The only concern I have for this gentleman is that he has more than just 1 lesion in his left kidney.  Furthermore, he has a contralateral right renal mass.  Sometimes oncocytomas can be bilateral in such disorders as Birt-Hogg-Dub  syndrome.  For now, his multiple bilateral renal lesions will be followed radiographically.  I will have him undergo a CT scan every 6 to 12 months to ensure there is no significant progression of any of his renal neoplasms.  If so, either a repeat biopsy or surgical resection would be necessary to definitively prove the pathology of the growing renal lesion in question.  I will see him back in July 2023 for repeat clinical assessment, with CT scans being done a day before his next visit to reassess the size of his bilateral renal masses.  The patient understands all the plans discussed today and is in agreement with them.  I do appreciate Angelina Sheriff, MD for his new consult.   Tadashi Burkel Macarthur Critchley, MD

## 2021-07-02 ENCOUNTER — Inpatient Hospital Stay: Payer: Medicare HMO | Attending: Oncology | Admitting: Oncology

## 2021-07-02 ENCOUNTER — Inpatient Hospital Stay: Payer: Medicare HMO

## 2021-07-02 ENCOUNTER — Telehealth: Payer: Self-pay | Admitting: Oncology

## 2021-07-02 ENCOUNTER — Other Ambulatory Visit: Payer: Self-pay

## 2021-07-02 ENCOUNTER — Encounter: Payer: Self-pay | Admitting: Oncology

## 2021-07-02 ENCOUNTER — Other Ambulatory Visit: Payer: Self-pay | Admitting: Oncology

## 2021-07-02 ENCOUNTER — Other Ambulatory Visit: Payer: Self-pay | Admitting: Hematology and Oncology

## 2021-07-02 DIAGNOSIS — D3002 Benign neoplasm of left kidney: Secondary | ICD-10-CM | POA: Insufficient documentation

## 2021-07-02 DIAGNOSIS — N2889 Other specified disorders of kidney and ureter: Secondary | ICD-10-CM

## 2021-07-02 DIAGNOSIS — I10 Essential (primary) hypertension: Secondary | ICD-10-CM

## 2021-07-02 DIAGNOSIS — Z808 Family history of malignant neoplasm of other organs or systems: Secondary | ICD-10-CM

## 2021-07-02 DIAGNOSIS — D3000 Benign neoplasm of unspecified kidney: Secondary | ICD-10-CM | POA: Diagnosis not present

## 2021-07-02 LAB — HEPATIC FUNCTION PANEL
ALT: 22 (ref 10–40)
AST: 25 (ref 14–40)
Alkaline Phosphatase: 74 (ref 25–125)
Bilirubin, Total: 0.8

## 2021-07-02 LAB — BASIC METABOLIC PANEL
BUN: 20 (ref 4–21)
CO2: 25 — AB (ref 13–22)
Chloride: 107 (ref 99–108)
Creatinine: 0.9 (ref 0.6–1.3)
Glucose: 113
Potassium: 4.3 (ref 3.4–5.3)
Sodium: 141 (ref 137–147)

## 2021-07-02 LAB — COMPREHENSIVE METABOLIC PANEL
Albumin: 3.9 (ref 3.5–5.0)
Calcium: 9 (ref 8.7–10.7)

## 2021-07-02 LAB — CBC: RBC: 4.93 (ref 3.87–5.11)

## 2021-07-02 LAB — CBC AND DIFFERENTIAL
HCT: 47 (ref 41–53)
Hemoglobin: 16 (ref 13.5–17.5)
Neutrophils Absolute: 5.77
Platelets: 165 (ref 150–399)
WBC: 7.9

## 2021-07-02 NOTE — Telephone Encounter (Signed)
Per 1/4 los next appt scheduled and given to patient °

## 2021-07-03 DIAGNOSIS — L578 Other skin changes due to chronic exposure to nonionizing radiation: Secondary | ICD-10-CM | POA: Diagnosis not present

## 2021-07-03 DIAGNOSIS — D2362 Other benign neoplasm of skin of left upper limb, including shoulder: Secondary | ICD-10-CM | POA: Diagnosis not present

## 2021-07-03 DIAGNOSIS — D225 Melanocytic nevi of trunk: Secondary | ICD-10-CM | POA: Diagnosis not present

## 2021-07-03 DIAGNOSIS — L570 Actinic keratosis: Secondary | ICD-10-CM | POA: Diagnosis not present

## 2021-07-03 DIAGNOSIS — D2372 Other benign neoplasm of skin of left lower limb, including hip: Secondary | ICD-10-CM | POA: Diagnosis not present

## 2021-07-29 DIAGNOSIS — E785 Hyperlipidemia, unspecified: Secondary | ICD-10-CM | POA: Diagnosis not present

## 2021-07-29 DIAGNOSIS — I10 Essential (primary) hypertension: Secondary | ICD-10-CM | POA: Diagnosis not present

## 2021-08-05 DIAGNOSIS — R5383 Other fatigue: Secondary | ICD-10-CM | POA: Diagnosis not present

## 2021-08-05 DIAGNOSIS — I480 Paroxysmal atrial fibrillation: Secondary | ICD-10-CM | POA: Diagnosis not present

## 2021-08-05 DIAGNOSIS — I10 Essential (primary) hypertension: Secondary | ICD-10-CM | POA: Diagnosis not present

## 2021-08-05 DIAGNOSIS — Z6841 Body Mass Index (BMI) 40.0 and over, adult: Secondary | ICD-10-CM | POA: Diagnosis not present

## 2021-08-08 DIAGNOSIS — N400 Enlarged prostate without lower urinary tract symptoms: Secondary | ICD-10-CM | POA: Diagnosis not present

## 2021-08-08 DIAGNOSIS — D4101 Neoplasm of uncertain behavior of right kidney: Secondary | ICD-10-CM | POA: Diagnosis not present

## 2021-08-15 DIAGNOSIS — D4101 Neoplasm of uncertain behavior of right kidney: Secondary | ICD-10-CM | POA: Diagnosis not present

## 2021-08-15 DIAGNOSIS — D4102 Neoplasm of uncertain behavior of left kidney: Secondary | ICD-10-CM | POA: Diagnosis not present

## 2021-08-15 DIAGNOSIS — N2889 Other specified disorders of kidney and ureter: Secondary | ICD-10-CM | POA: Diagnosis not present

## 2021-08-19 ENCOUNTER — Other Ambulatory Visit (HOSPITAL_COMMUNITY): Payer: Self-pay | Admitting: Urology

## 2021-08-19 ENCOUNTER — Other Ambulatory Visit: Payer: Self-pay | Admitting: Urology

## 2021-08-19 DIAGNOSIS — D4102 Neoplasm of uncertain behavior of left kidney: Secondary | ICD-10-CM

## 2021-08-19 DIAGNOSIS — D4101 Neoplasm of uncertain behavior of right kidney: Secondary | ICD-10-CM

## 2021-08-21 ENCOUNTER — Other Ambulatory Visit (HOSPITAL_COMMUNITY): Payer: Self-pay | Admitting: Urology

## 2021-08-21 ENCOUNTER — Encounter (HOSPITAL_COMMUNITY): Payer: Self-pay

## 2021-08-21 DIAGNOSIS — N2889 Other specified disorders of kidney and ureter: Secondary | ICD-10-CM

## 2021-08-21 DIAGNOSIS — D4101 Neoplasm of uncertain behavior of right kidney: Secondary | ICD-10-CM

## 2021-08-21 DIAGNOSIS — D4102 Neoplasm of uncertain behavior of left kidney: Secondary | ICD-10-CM

## 2021-08-21 NOTE — Progress Notes (Signed)
Patient Name  James Schaefer, Montz Legal Sex  Male DOB  Nov 08, 1950 SSN  [id number] Address  [place] [address] Phone  [phone] [facility])  [phone] (Mobile) *Preferred*    RE: US BIOPSY Received: Today [name], DO  Diana Eves M Discussed with Dr. Bobby Rumpf.  He states he did not request this biopsy. Perhaps outdated?   His office will reach out to [place] with any future request.  Earleen Newport        Previous Messages   ----- Message -----  From: [name]  Sent: 08/21/2021  12:47 PM EST  To: Ir Procedure Requests  Subject: US BIOPSY                                       US BIOPSY KIDNEY      Putting in Review part Random and needs Review         Reason: Bilateral renal mass, Neoplasm of uncertain behavior of left kidney  Neoplasm of uncertain behavior of right kidney.            History: CT and MRI done at [facility], Renal Biopsy Done 12/22           Provider: Lavera Guise A         [place]

## 2021-08-22 DIAGNOSIS — Z466 Encounter for fitting and adjustment of urinary device: Secondary | ICD-10-CM | POA: Diagnosis not present

## 2021-08-22 DIAGNOSIS — T83122A Displacement of urinary stent, initial encounter: Secondary | ICD-10-CM | POA: Diagnosis not present

## 2021-08-25 ENCOUNTER — Other Ambulatory Visit (HOSPITAL_COMMUNITY): Payer: Self-pay | Admitting: Urology

## 2021-08-25 ENCOUNTER — Encounter (HOSPITAL_COMMUNITY): Payer: Self-pay

## 2021-08-25 DIAGNOSIS — D4101 Neoplasm of uncertain behavior of right kidney: Secondary | ICD-10-CM

## 2021-08-25 DIAGNOSIS — N2889 Other specified disorders of kidney and ureter: Secondary | ICD-10-CM

## 2021-08-25 DIAGNOSIS — D4102 Neoplasm of uncertain behavior of left kidney: Secondary | ICD-10-CM

## 2021-08-25 NOTE — Progress Notes (Signed)
Patient Name  James Schaefer, James Schaefer Legal Sex  Male DOB  06-Aug-1950 SSN  [id number] Address  [place] [address] Phone  [phone] [facility])  [phone] (Mobile) *Preferred*    RE: US BIOPSY Received: Today Arne Cleveland, MD  [place], [place]   CT core biopsy R renal lesion OR 2nd left renal lesion, prob not both on same day in case of bleeding complication.   DDH        Previous Messages   ----- Message -----  From: [name]  Sent: 08/22/2021   5:34 PM EST  To: Arne Cleveland, MD  Subject: RE: US BIOPSY                                   It's in [employer] under Notes    ----- Message -----  From: Arne Cleveland, MD  Sent: 08/22/2021   4:33 PM EST  To: [name]  Subject: RE: US BIOPSY                                   Would you scan in most recent visit notes from Dr. Gloriann Loan so we can understand the request a little better? The patient has a lot going on, multiple renal and liver lesions, 2 biopsies already . . .   Thx  DDH     ----- Message -----  From: [name]  Sent: 08/22/2021   2:04 PM EST  To: Ir Procedure Requests  Subject: US BIOPSY                                       US BIOPSY KIDNEY      Putting in Review part Random and needs Review           Reason: Bilateral renal mass, Neoplasm of uncertain behavior of left kidney  Neoplasm of uncertain behavior of right kidney.             History: CT and MRI done at [facility], Renal Biopsy Done 12/22           Provider: Marton Redwood III           Contact: [phone]

## 2021-09-05 ENCOUNTER — Inpatient Hospital Stay (HOSPITAL_COMMUNITY): Admission: RE | Admit: 2021-09-05 | Payer: Medicare HMO | Source: Ambulatory Visit

## 2021-09-05 ENCOUNTER — Encounter (HOSPITAL_COMMUNITY): Payer: Self-pay

## 2021-09-30 ENCOUNTER — Other Ambulatory Visit (HOSPITAL_COMMUNITY): Payer: Self-pay | Admitting: Physician Assistant

## 2021-09-30 ENCOUNTER — Other Ambulatory Visit: Payer: Self-pay | Admitting: Student

## 2021-09-30 NOTE — H&P (Signed)
? ?Chief Complaint: ?Patient was seen in consultation today for image guided renal lesion biopsy at the request of [name],James Schaefer ? ?Referring Physician(s): [name],James Schaefer ? ?Supervising Physician: [name] ? ?Patient Status: [facility] - Out-pt ? ?History of Present Illness: ?James Schaefer is a 71 y.o. male with PMHs of HTN, HLD, chronic venous insufficiency, chronic a.fib on Xarelto, PE/DVT (2015) s/p IVC filter placement and liver/renal lesions of unknown etiology who is known to IR service for left renal lesion biopsy on 06/02/21 performed by Dr. [name], pathology revealed renal oncocytoma.  ? ?Of note, patient was found to have multiple lesions on both kidneys and within his liver on CT abd/pelvis and on follow up MRI. He underwent a liver lesion biopsy at [facility] and pathology showed this to be a hemangioma. IR has been asked to perform a biopsy of the renal lesions to further direct care. ? ?Per radiology order, states that the patient's case was presented at the tumor board, decision was made to repeat renal biopsy for further eval and management.  ? ?Patient laying in bed, not in acute distress.  ?States that there is no changes in his health, but Dr. Gloriann Loan wants to evaluate right kidney lesion for thorough evaluation.  ?Denise headache, fever, chills, shortness of breath, cough, chest pain, abdominal pain, nausea ,vomiting, and bleeding. ? ?Past Medical History:  ?Diagnosis Date  ? Hypercholesteremia   ? Hypertension   ? ? ?Past Surgical History:  ?Procedure Laterality Date  ? HERNIA REPAIR    ? INTRAMEDULLARY (IM) NAIL INTERTROCHANTERIC Right 09/04/2013  ? Procedure: INTRAMEDULLARY (IM) NAIL INTERTROCHANTRIC;  Surgeon: [name], MD;  Location: [facility];  Service: Orthopedics;  Laterality: Right;  ? knee replacements    ? penile pump    ? ? ?Allergies: ?Pedi-pre tape spray [wound dressing adhesive] ? ?Medications: ?Prior to Admission medications   ?Medication Sig Start Date End Date  Taking? Authorizing Provider  ?acetaminophen (TYLENOL) 325 MG tablet Take 2 tablets (650 mg total) by mouth every 6 (six) hours as needed for mild pain (or Fever >/= 101). 09/08/13   [name], MD  ?diltiazem [facility]) 180 MG 24 hr capsule Take 180 mg by mouth daily.    [provider]  ?hydrALAZINE (APRESOLINE) 25 MG tablet Take 25 mg by mouth 3 (three) times daily.    [provider]  ?lisinopril (PRINIVIL,ZESTRIL) 10 MG tablet Take 1 tablet (10 mg total) by mouth every evening. ?Patient taking differently: Take 40 mg by mouth every evening. 09/08/13   [name], MD  ?methocarbamol (ROBAXIN) 500 MG tablet Take 1 tablet (500 mg total) by mouth every 6 (six) hours as needed for muscle spasms. 09/04/13   Chadwell, [name], PA-C  ?oxyCODONE-acetaminophen (ROXICET) 5-325 MG per tablet Take 1-2 tablets by mouth every 4 (four) hours as needed for severe pain. 09/04/13   Chadwell, [name], PA-C  ?pravastatin (PRAVACHOL) 40 MG tablet Take 40 mg by mouth every evening.    [provider]  ?promethazine (PHENERGAN) 12.5 MG tablet Take 1 tablet (12.5 mg total) by mouth every 6 (six) hours as needed for nausea or vomiting. 09/08/13   [name], MD  ?rivaroxaban (XARELTO) 20 MG TABS tablet Take 20 mg by mouth daily with supper.    [provider]  ?vitamin B-12 (CYANOCOBALAMIN) 1000 MCG tablet Take 1,000 mcg by mouth every evening.    [provider]  ?warfarin (COUMADIN) 5 MG tablet Take 1 tablet (5 mg  total) by mouth daily. TO BE ADJUSTED ACCORDING TO INR GOAL RANGE 2-3.  Total time for coumadin 4 weeks post op, d/c on 10/02/13. 09/08/13   Chadwell, [name], PA-C  ?  ? ?Family History  ?Problem Relation Age of Onset  ? CAD Father   ? Kidney failure Father   ? Pancreatic cancer Maternal Uncle   ? ? ?Social History  ? ?Socioeconomic History  ? Marital status: Married  ?  Spouse name: [name]  ? Number of children: 2  ? Years of education: 54 + SOME COLLEGE  ? Highest  education level: Not on file  ?Occupational History  ? Occupation: RETIRED [employer]  ?Tobacco Use  ? Smoking status: Former  ?  Types: Cigarettes  ?  Quit date: 09/04/1978  ?  Years since quitting: 43.1  ? Smokeless tobacco: Former  ?  Quit date: 09/04/1978  ?Vaping Use  ? Vaping Use: Never used  ?Substance and Sexual Activity  ? Alcohol use: Yes  ?  Comment: occassional  ? Drug use: No  ? Sexual activity: Not on file  ?Other Topics Concern  ? Not on file  ?Social History Narrative  ? Not on file  ? ?Social Determinants of Health  ? ?Financial Resource Strain: Not on file  ?Food Insecurity: Not on file  ?Transportation Needs: Not on file  ?Physical Activity: Not on file  ?Stress: Not on file  ?Social Connections: Not on file  ? ? ? ?Review of Systems: A 12 point ROS discussed and pertinent positives are indicated in the HPI above.  All other systems are negative. ? ?Vital Signs: ?BP (!) 155/91   Pulse 75   Temp 98.1 ?F (36.7 ?C) (Oral)   Ht '6\' 4"'$  (1.93 m)   Wt (!) 355 lb (161 kg)   SpO2 96%   BMI 43.21 kg/m?  ? ? ?Physical Exam ?Vitals reviewed.  ?Constitutional:   ?   General: He is not in acute distress. ?   Appearance: Normal appearance. He is not ill-appearing.  ?HENT:  ?   Head: Normocephalic and atraumatic.  ?   Mouth/Throat:  ?   Mouth: Mucous membranes are moist.  ?Cardiovascular:  ?   Rate and Rhythm: Normal rate. Rhythm irregular.  ?   Heart sounds: Normal heart sounds.  ?Pulmonary:  ?   Effort: Pulmonary effort is normal.  ?   Breath sounds: Normal breath sounds.  ?Abdominal:  ?   Palpations: Abdomen is soft.  ?Musculoskeletal:  ?   Cervical back: Neck supple.  ?Skin: ?   General: Skin is warm and dry.  ?   Coloration: Skin is not jaundiced or pale.  ?Neurological:  ?   Mental Status: He is alert and oriented to person, place, and time.  ?Psychiatric:     ?   Mood and Affect: Mood normal.     ?   Behavior: Behavior normal.     ?   Judgment: Judgment normal.  ? ? ?MD Evaluation ?Airway: WNL ?Heart:  WNL ?Abdomen: WNL ?Chest/ Lungs: WNL ?ASA  Classification: 2 ?Mallampati/Airway Score: Two ? ?Imaging: ?No results found. ? ?Labs: ? ?CBC: ?Recent Labs  ?  05/30/21 ?0945 06/02/21 ?0737 07/02/21 ?0000 10/01/21 ?0645  ?WBC 7.0 7.3 7.9 6.6  ?HGB 15.8 16.2 16.0 15.3  ?HCT 47.3 47.1 47 45.9  ?PLT 216 192 165 198  ? ? ?COAGS: ?Recent Labs  ?  05/30/21 ?1115 06/02/21 ?6712 10/01/21 ?0645  ?INR 1.0 1.0 1.1  ?APTT  --  34  --   ? ? ?BMP: ?Recent Labs  ?  07/02/21 ?0000  ?NA 141  ?K 4.3  ?CL 107  ?CO2 25*  ?BUN 20  ?CALCIUM 9.0  ?CREATININE 0.9  ? ? ?LIVER FUNCTION TESTS: ?Recent Labs  ?  07/02/21 ?0000  ?AST 25  ?ALT 22  ?ALKPHOS 74  ?ALBUMIN 3.9  ? ? ?TUMOR MARKERS: ?No results for input(s): AFPTM, CEA, CA199, CHROMGRNA in the last 8760 hours. ? ?Assessment and Plan: ?71 y.o. male with bilateral renal lesion s/p left renal lesion bx, who is in need of right renal lesion bx for further eval and management.  ? ?NPO ?VSS ?Lbas normal  ?Last Xarelto on Sunday  ? ?Risks and benefits of right renal lesion bx was discussed with the patient and/or patient's family including, but not limited to bleeding, infection, damage to adjacent structures or low yield requiring additional tests. ? ?All of the questions were answered and there is agreement to proceed. ? ?Consent signed and in chart. ? ? ? ? ?Thank you for this interesting consult.  I greatly enjoyed meeting Ryatt Corsino and look forward to participating in their care.  A copy of this report was sent to the requesting provider on this date. ? ?Electronically Signed: ?Tera Mater, PA-C ?10/01/2021, 8:12 AM ? ? ?I spent a total of    25 Minutes in face to face in clinical consultation, greater than 50% of which was counseling/coordinating care for renal lesion bx.  ? ?This chart was dictated using voice recognition software.  Despite best efforts to proofread,  errors can occur which can change the documentation meaning.  ? ?

## 2021-10-01 ENCOUNTER — Other Ambulatory Visit: Payer: Self-pay

## 2021-10-01 ENCOUNTER — Ambulatory Visit (HOSPITAL_COMMUNITY)
Admission: RE | Admit: 2021-10-01 | Discharge: 2021-10-01 | Disposition: A | Discharge status: Home / Self Care | Payer: Medicare HMO | Source: Ambulatory Visit | Attending: Urology | Admitting: Urology

## 2021-10-01 ENCOUNTER — Encounter (HOSPITAL_COMMUNITY): Payer: Self-pay

## 2021-10-01 DIAGNOSIS — I482 Chronic atrial fibrillation, unspecified: Secondary | ICD-10-CM | POA: Insufficient documentation

## 2021-10-01 DIAGNOSIS — N2889 Other specified disorders of kidney and ureter: Secondary | ICD-10-CM | POA: Diagnosis not present

## 2021-10-01 DIAGNOSIS — E785 Hyperlipidemia, unspecified: Secondary | ICD-10-CM | POA: Diagnosis not present

## 2021-10-01 DIAGNOSIS — Z86711 Personal history of pulmonary embolism: Secondary | ICD-10-CM | POA: Insufficient documentation

## 2021-10-01 DIAGNOSIS — D49511 Neoplasm of unspecified behavior of right kidney: Secondary | ICD-10-CM | POA: Diagnosis not present

## 2021-10-01 DIAGNOSIS — D4102 Neoplasm of uncertain behavior of left kidney: Secondary | ICD-10-CM | POA: Diagnosis not present

## 2021-10-01 DIAGNOSIS — I872 Venous insufficiency (chronic) (peripheral): Secondary | ICD-10-CM | POA: Diagnosis not present

## 2021-10-01 DIAGNOSIS — Z86718 Personal history of other venous thrombosis and embolism: Secondary | ICD-10-CM | POA: Diagnosis not present

## 2021-10-01 DIAGNOSIS — I10 Essential (primary) hypertension: Secondary | ICD-10-CM | POA: Insufficient documentation

## 2021-10-01 DIAGNOSIS — D4101 Neoplasm of uncertain behavior of right kidney: Secondary | ICD-10-CM | POA: Diagnosis not present

## 2021-10-01 LAB — CBC
HCT: 45.9 % (ref 39.0–52.0)
Hemoglobin: 15.3 g/dL (ref 13.0–17.0)
MCH: 32.2 pg (ref 26.0–34.0)
MCHC: 33.3 g/dL (ref 30.0–36.0)
MCV: 96.6 fL (ref 80.0–100.0)
Platelets: 198 10*3/uL (ref 150–400)
RBC: 4.75 MIL/uL (ref 4.22–5.81)
RDW: 13.4 % (ref 11.5–15.5)
WBC: 6.6 10*3/uL (ref 4.0–10.5)
nRBC: 0 % (ref 0.0–0.2)

## 2021-10-01 LAB — PROTIME-INR
INR: 1.1 (ref 0.8–1.2)
Prothrombin Time: 14.3 seconds (ref 11.4–15.2)

## 2021-10-01 MED ORDER — SODIUM CHLORIDE 0.9 % IV SOLN: INTRAVENOUS | Status: DC

## 2021-10-01 MED ORDER — FENTANYL CITRATE (PF) 100 MCG/2ML IJ SOLN
INTRAMUSCULAR | Status: AC
  Filled 2021-10-01: qty 2

## 2021-10-01 MED ORDER — FENTANYL CITRATE (PF) 100 MCG/2ML IJ SOLN
INTRAMUSCULAR | Status: AC | PRN
  Administered 2021-10-01 (×3): 25 ug via INTRAVENOUS

## 2021-10-01 MED ORDER — LIDOCAINE HCL 1 % IJ SOLN
INTRAMUSCULAR | Status: AC
  Filled 2021-10-01: qty 10

## 2021-10-01 MED ORDER — MIDAZOLAM HCL 2 MG/2ML IJ SOLN
INTRAMUSCULAR | Status: AC | PRN
  Administered 2021-10-01 (×3): .5 mg via INTRAVENOUS

## 2021-10-01 MED ORDER — GELATIN ABSORBABLE 12-7 MM EX MISC
CUTANEOUS | Status: AC
  Filled 2021-10-01: qty 1

## 2021-10-01 MED ORDER — MIDAZOLAM HCL 2 MG/2ML IJ SOLN
INTRAMUSCULAR | Status: AC
  Filled 2021-10-01: qty 2

## 2021-10-01 NOTE — Procedures (Signed)
Interventional Radiology Procedure Note ? ?Procedure: CT guided biopsy of right renal mass ?Complications: None ?EBL: None ?Recommendations: ?- Bedrest 2 hours.   ?- Routine wound care ?- Follow up pathology ?- Advance diet  ? ?Signed, ? ?[name], DO ? ? ?

## 2021-10-01 NOTE — Sedation Documentation (Signed)
Kidney bx puncture site is clean, dry and intact.  ?

## 2021-10-02 DIAGNOSIS — D4102 Neoplasm of uncertain behavior of left kidney: Secondary | ICD-10-CM | POA: Diagnosis not present

## 2021-10-02 DIAGNOSIS — D4101 Neoplasm of uncertain behavior of right kidney: Secondary | ICD-10-CM | POA: Diagnosis not present

## 2021-10-06 DIAGNOSIS — I10 Essential (primary) hypertension: Secondary | ICD-10-CM | POA: Diagnosis not present

## 2021-10-06 DIAGNOSIS — D3002 Benign neoplasm of left kidney: Secondary | ICD-10-CM | POA: Diagnosis not present

## 2021-10-06 DIAGNOSIS — E8881 Metabolic syndrome: Secondary | ICD-10-CM | POA: Diagnosis not present

## 2021-10-06 DIAGNOSIS — Z6841 Body Mass Index (BMI) 40.0 and over, adult: Secondary | ICD-10-CM | POA: Diagnosis not present

## 2021-10-06 DIAGNOSIS — Z1331 Encounter for screening for depression: Secondary | ICD-10-CM | POA: Diagnosis not present

## 2021-10-06 LAB — SURGICAL PATHOLOGY

## 2021-12-24 DIAGNOSIS — Z7901 Long term (current) use of anticoagulants: Secondary | ICD-10-CM | POA: Diagnosis not present

## 2021-12-24 DIAGNOSIS — I872 Venous insufficiency (chronic) (peripheral): Secondary | ICD-10-CM | POA: Diagnosis not present

## 2021-12-24 DIAGNOSIS — Z86711 Personal history of pulmonary embolism: Secondary | ICD-10-CM | POA: Diagnosis not present

## 2021-12-24 DIAGNOSIS — I482 Chronic atrial fibrillation, unspecified: Secondary | ICD-10-CM | POA: Diagnosis not present

## 2021-12-24 DIAGNOSIS — I10 Essential (primary) hypertension: Secondary | ICD-10-CM | POA: Diagnosis not present

## 2021-12-25 DIAGNOSIS — I482 Chronic atrial fibrillation, unspecified: Secondary | ICD-10-CM | POA: Diagnosis not present

## 2021-12-31 DIAGNOSIS — R1900 Intra-abdominal and pelvic swelling, mass and lump, unspecified site: Secondary | ICD-10-CM | POA: Diagnosis not present

## 2021-12-31 DIAGNOSIS — R160 Hepatomegaly, not elsewhere classified: Secondary | ICD-10-CM | POA: Diagnosis not present

## 2021-12-31 DIAGNOSIS — C787 Secondary malignant neoplasm of liver and intrahepatic bile duct: Secondary | ICD-10-CM | POA: Diagnosis not present

## 2021-12-31 DIAGNOSIS — N2889 Other specified disorders of kidney and ureter: Secondary | ICD-10-CM | POA: Diagnosis not present

## 2021-12-31 DIAGNOSIS — N200 Calculus of kidney: Secondary | ICD-10-CM | POA: Diagnosis not present

## 2021-12-31 NOTE — Progress Notes (Signed)
[facility]  [address] [place],  [place]  [address] [phone]  Clinic Day:  01/01/2022  Referring physician: Angelina Sheriff, MD   HISTORY OF PRESENT ILLNESS:  The patient is a 71 y.o. male who I follow for a biopsy-proven oncocytoma of the left kidney.  Furthermore, scans have shown bilateral renal masses.  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  He comes in today to go over his CT scans to ensure his bilateral renal masses have not changed in size.  Since his last visit, the patient has been doing well.  He denies having any hematuria, dysuria, or costovertebral angle tenderness which concerns him for possible disease progression.  PHYSICAL EXAM:  Blood pressure (!) 159/89, pulse (!) 50, temperature 97.7 F (36.5 C), resp. rate 18, height '6\' 3"'$  (1.905 m), weight (!) 349 lb 14.4 oz (158.7 kg), SpO2 97 %. Wt Readings from Last 3 Encounters:  01/01/22 (!) 349 lb 14.4 oz (158.7 kg)  10/01/21 (!) 355 lb (161 kg)  07/02/21 (!) 354 lb 3.2 oz (160.7 kg)   Body mass index is 43.73 kg/m. Performance status (ECOG): 1 - Symptomatic but completely ambulatory Physical Exam Constitutional:      Appearance: Normal appearance. He is not ill-appearing.  HENT:     Mouth/Throat:     Mouth: Mucous membranes are moist.     Pharynx: Oropharynx is clear. No oropharyngeal exudate or posterior oropharyngeal erythema.  Cardiovascular:     Rate and Rhythm: Normal rate and regular rhythm.     Heart sounds: No murmur heard.    No friction rub. No gallop.  Pulmonary:     Effort: Pulmonary effort is normal. No respiratory distress.     Breath sounds: Normal breath sounds. No wheezing, rhonchi or rales.  Abdominal:     General: Bowel sounds are normal. There is no distension.     Palpations: Abdomen is soft. There is no mass.     Tenderness: There is no abdominal  tenderness.  Musculoskeletal:        General: No swelling.     Right lower leg: No edema.     Left lower leg: No edema.  Lymphadenopathy:     Cervical: No cervical adenopathy.     Upper Body:     Right upper body: No supraclavicular or axillary adenopathy.     Left upper body: No supraclavicular or axillary adenopathy.     Lower Body: No right inguinal adenopathy. No left inguinal adenopathy.  Skin:    General: Skin is warm.     Coloration: Skin is not jaundiced.     Findings: No lesion or rash.  Neurological:     General: No focal deficit present.     Mental Status: He is alert and oriented to person, place, and time. Mental status is at baseline.  Psychiatric:        Mood and Affect: Mood normal.        Behavior: Behavior normal.        Thought Content: Thought content normal.   SCANS: CT scans of his abdomen/pelvis revealed the following: FINDINGS: Lower chest: No acute abnormality.  Hepatobiliary: Heterogeneously rim enhancing mass of the right lobe of the liver, hepatic segment V is unchanged, measuring 3.9 x 3.2 cm (series 310, image 44). Previously noted lesion of hepatic segment VI posterior to the above described lesion is not clearly appreciated on today's  examination (series 311, image 17). No gallstones, gallbladder wall thickening, or biliary dilatation.  Pancreas: Unremarkable. No pancreatic ductal dilatation or surrounding inflammatory changes.  Spleen: Normal in size without significant abnormality.  Adrenals/Urinary Tract: Adrenal glands are unremarkable. Multiple heterogeneously hypoenhancing renal masses are not significantly changed, largest index mass of the inferior pole of the right kidney measuring 6.4 x 6.2 cm (series 311, image 47), additional index mass within the pelvis of the right kidney measuring 5.9 x 4.2 cm (series 311, image 42). The right renal pelvis is distorted by the mass described above, and there is a 0.5 cm calculus within the  right renal pelvis without hydronephrosis (series 3, image 48). Additional small bilateral nonobstructive renal calculi.  Stomach/Bowel: Stomach is within normal limits. No evidence of bowel wall thickening, distention, or inflammatory changes.  Vascular/Lymphatic: Infrarenal IVC filter. Scattered aortic atherosclerosis. Prominent celiac axis/gastrohepatic ligament node is unchanged, measuring 2.1 x 1.4 cm (series 310, image 36)  Other: No abdominal wall hernia or abnormality. No ascites.  Musculoskeletal: No acute osseous findings. Mildly heterogeneous appearance of the included vertebral bodies.  IMPRESSION: 1. Multiple heterogeneously hypoenhancing renal masses are not significantly changed, consistent with metastases. 2. Heterogeneously rim enhancing mass of the right lobe of the liver, hepatic segment V is unchanged. Previously noted lesion of hepatic segment VI posterior to the above described lesion is not clearly appreciated on today's examination, appearance of this small lesion likely very sensitive to exact phase of contrast administration. No new liver lesions. 3. Unchanged prominent celiac axis lymph node, nonspecific. 4. Mildly heterogeneous appearance of the included vertebral bodies, suspected osseous metastatic disease better appreciated by prior MR. 5. Nonobstructive bilateral nephrolithiasis.  Aortic Atherosclerosis (ICD10-I70.0).  LABS:      Latest Ref Rng & Units 10/01/2021    6:45 AM 07/02/2021   12:00 AM 06/02/2021    7:37 AM  CBC  WBC 4.0 - 10.5 K/uL 6.6  7.9     7.3   Hemoglobin 13.0 - 17.0 g/dL 15.3  16.0     16.2   Hematocrit 39.0 - 52.0 % 45.9  47     47.1   Platelets 150 - 400 K/uL 198  165     192      This result is from an external source.      Latest Ref Rng & Units 07/02/2021   12:00 AM 09/08/2013    4:10 AM 09/07/2013    5:40 AM  CMP  Glucose 70 - 99 mg/dL  112  125   BUN 4 - '21 20     25  27   '$ Creatinine 0.6 - 1.3 0.9     0.87  0.85    Sodium 137 - 147 141     139  139   Potassium 3.4 - 5.3 4.3     3.5  3.7   Chloride 99 - 108 107     96  98   CO2 13 - '22 25     29  30   '$ Calcium 8.7 - 10.7 9.0     8.5  8.6   Alkaline Phos 25 - 125 74        AST 14 - 40 25        ALT 10 - 40 22           This result is from an external source.   ASSESSMENT & PLAN:  A 71 y.o. male with a biopsy-proven left renal oncocytoma.  As what  has been discussed with the patient previously, an oncocytoma is usually not considered a malignant neoplasm.  It can potentially grow over time, but it rarely, if ever, metastasizes.  Once again, his CT scans showed that his bilateral renal masses remain stable in size.  The fact that these lesions have not grown suggest the potential for Birt-Hogg-Dub syndrome.  As none of his renal lesions have progressed in size, they will continue to be followed conservatively.  Any evidence of progression with any of his masses would lead to another renal biopsy being considered of the lesion in question.  The fact that he has bilateral renal lesions involving a significant portion of his renal parenchyma theoretically prevents him from undergoing extensive surgery to eradicate all of his disease.  Overall, the patient is doing well.  He remains fine with his continued radiographic surveillance.  As that is the case, I will see him back in 6 months for repeat clinical assessment.  CT scans will be done a day before his next visit to ensure none of his bilateral renal masses have enlarged in size.  The patient understands all the plans discussed today and is in agreement with them.   Sharice Harriss Macarthur Critchley, MD

## 2022-01-01 ENCOUNTER — Inpatient Hospital Stay: Payer: Medicare HMO | Attending: Oncology | Admitting: Oncology

## 2022-01-01 ENCOUNTER — Other Ambulatory Visit: Payer: Self-pay | Admitting: Oncology

## 2022-01-01 VITALS — BP 159/89 | HR 50 | Temp 97.7°F | Resp 18 | Ht 75.0 in | Wt 349.9 lb

## 2022-01-01 DIAGNOSIS — D3002 Benign neoplasm of left kidney: Secondary | ICD-10-CM

## 2022-01-08 DIAGNOSIS — L918 Other hypertrophic disorders of the skin: Secondary | ICD-10-CM | POA: Diagnosis not present

## 2022-01-08 DIAGNOSIS — Z86018 Personal history of other benign neoplasm: Secondary | ICD-10-CM | POA: Diagnosis not present

## 2022-01-08 DIAGNOSIS — D229 Melanocytic nevi, unspecified: Secondary | ICD-10-CM | POA: Diagnosis not present

## 2022-01-08 DIAGNOSIS — D2362 Other benign neoplasm of skin of left upper limb, including shoulder: Secondary | ICD-10-CM | POA: Diagnosis not present

## 2022-01-08 DIAGNOSIS — L570 Actinic keratosis: Secondary | ICD-10-CM | POA: Diagnosis not present

## 2022-01-08 DIAGNOSIS — L578 Other skin changes due to chronic exposure to nonionizing radiation: Secondary | ICD-10-CM | POA: Diagnosis not present

## 2022-01-08 DIAGNOSIS — D2372 Other benign neoplasm of skin of left lower limb, including hip: Secondary | ICD-10-CM | POA: Diagnosis not present

## 2022-01-26 DIAGNOSIS — I10 Essential (primary) hypertension: Secondary | ICD-10-CM | POA: Diagnosis not present

## 2022-01-26 DIAGNOSIS — E785 Hyperlipidemia, unspecified: Secondary | ICD-10-CM | POA: Diagnosis not present

## 2022-04-09 DIAGNOSIS — R351 Nocturia: Secondary | ICD-10-CM | POA: Diagnosis not present

## 2022-04-09 DIAGNOSIS — D4102 Neoplasm of uncertain behavior of left kidney: Secondary | ICD-10-CM | POA: Diagnosis not present

## 2022-04-09 DIAGNOSIS — D4101 Neoplasm of uncertain behavior of right kidney: Secondary | ICD-10-CM | POA: Diagnosis not present

## 2022-04-09 DIAGNOSIS — R3912 Poor urinary stream: Secondary | ICD-10-CM | POA: Diagnosis not present

## 2022-04-17 IMAGING — CT CT BIOPSY CORE RENAL
1 of 5 series · 13 of 32 positions shown, 18 images · non-contrast
Comparison: none

INDICATION: 71-year-old male with multiple bilateral renal mass referred for
biopsy of the right kidney mass

[Series 2: i-spiral 5.0 b40f · axial · 0.98mm/px · z∈[+1306,+1499]mm · 13 of 65 slices shown, 18 images]
[im 5/65  soft-tissue]
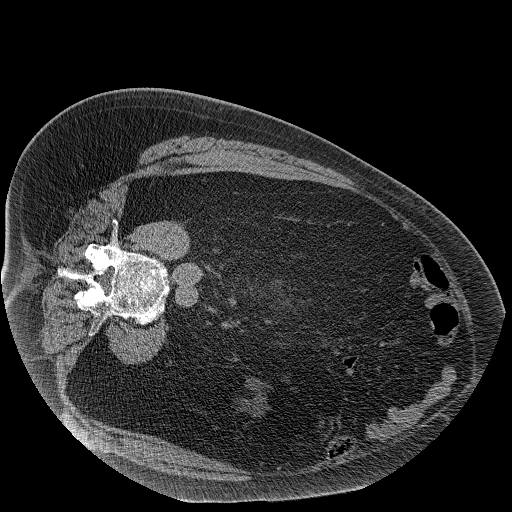
[im 5/65  bone]
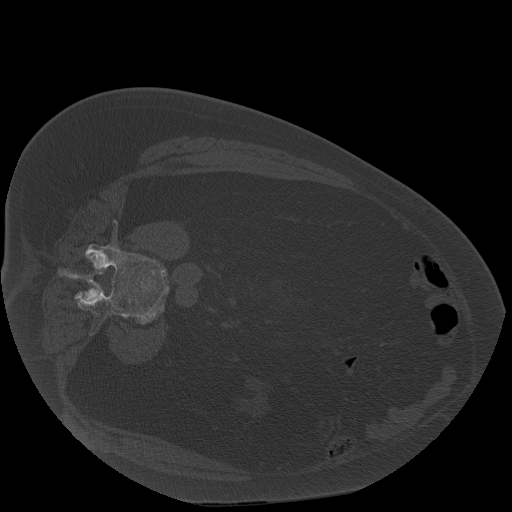
[im 9/65  soft-tissue]
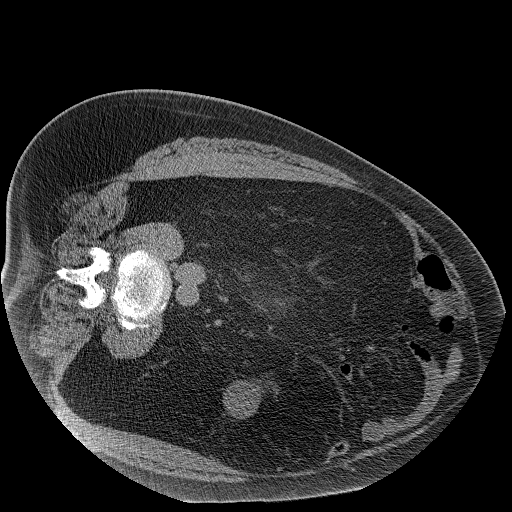
[im 13/65  soft-tissue]
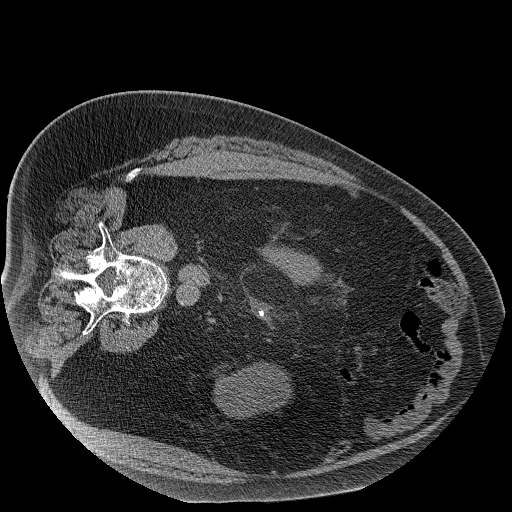
[im 22/65  soft-tissue]
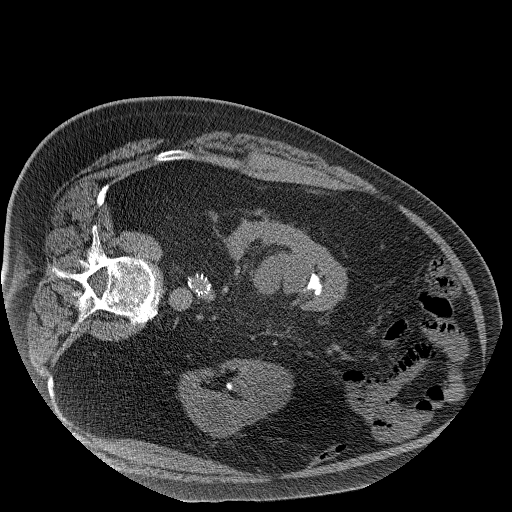
[im 26/65  soft-tissue]
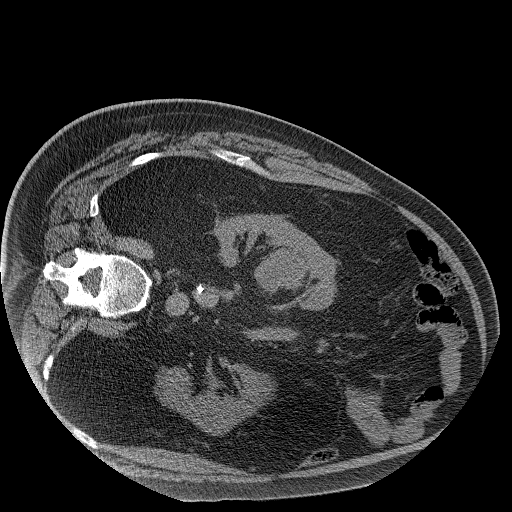
[im 30/65  soft-tissue]
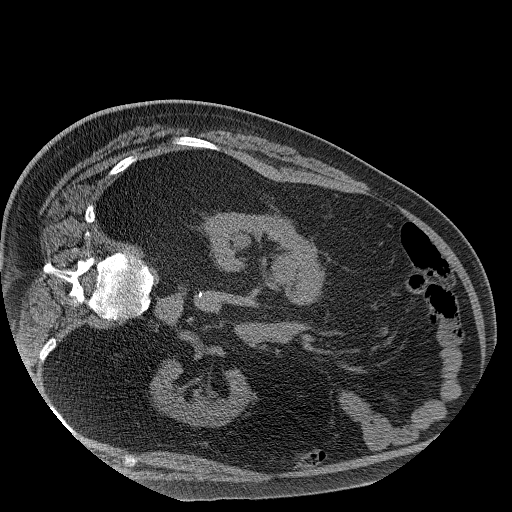
[im 35/65  soft-tissue]
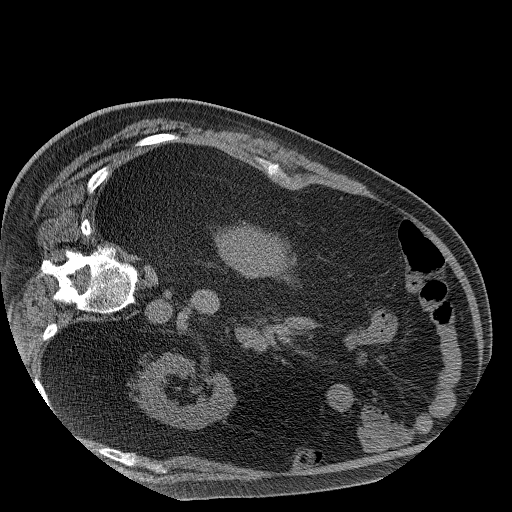
[im 39/65  soft-tissue]
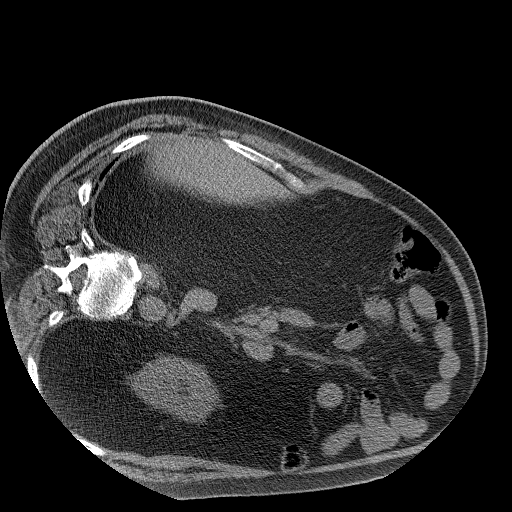
[im 43/65  soft-tissue]
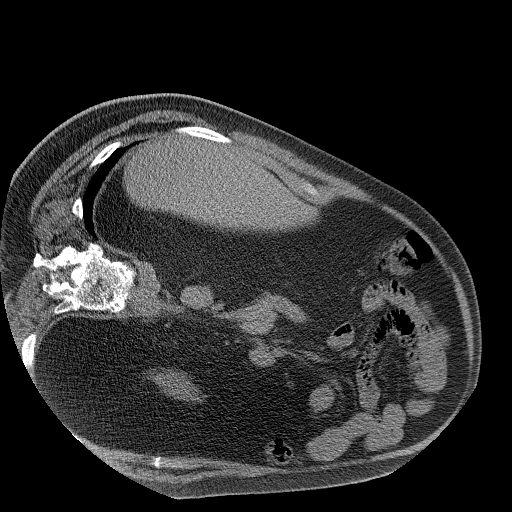
[im 43/65  bone]
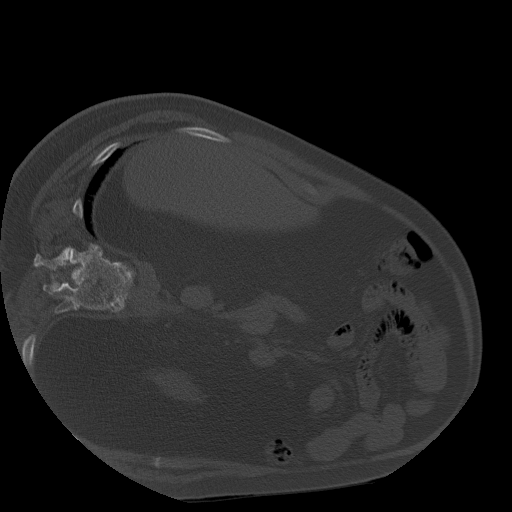
[im 47/65  lung]
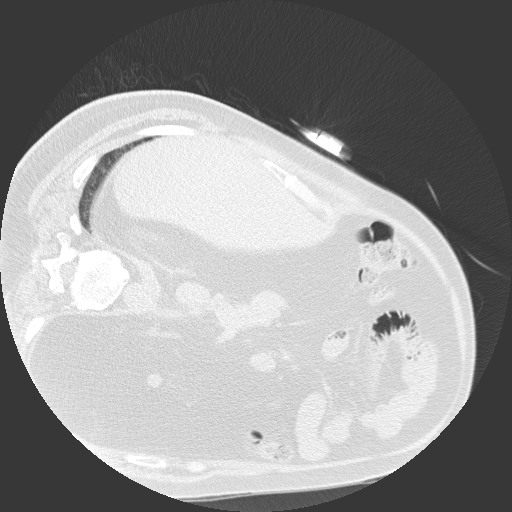
[im 52/65  soft-tissue]
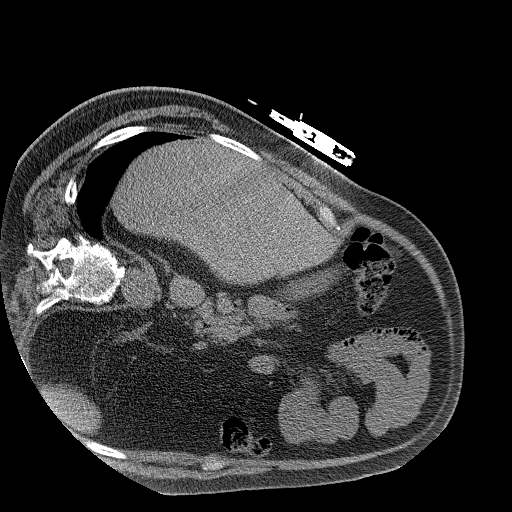
[im 52/65  lung]
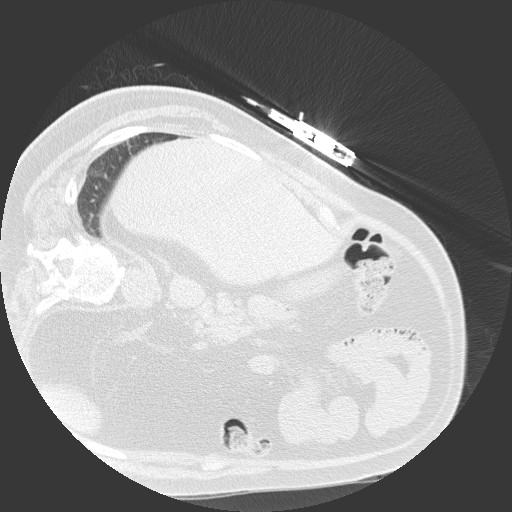
[im 56/65  soft-tissue]
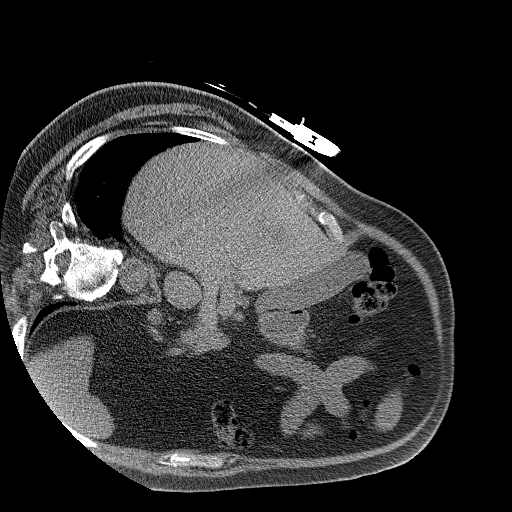
[im 56/65  lung]
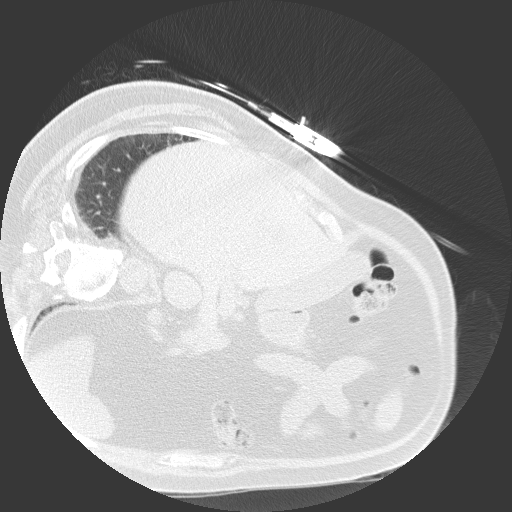
[im 60/65  soft-tissue]
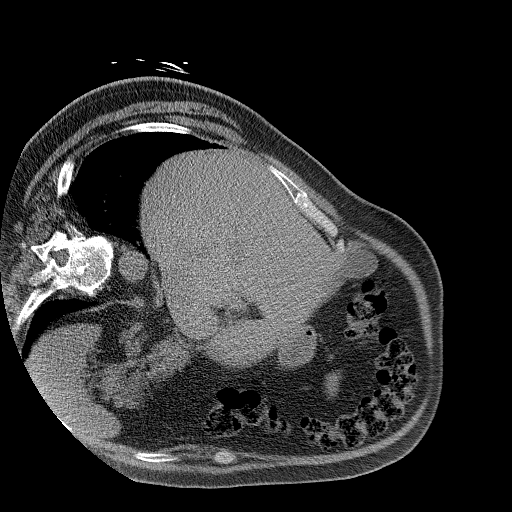
[im 60/65  lung]
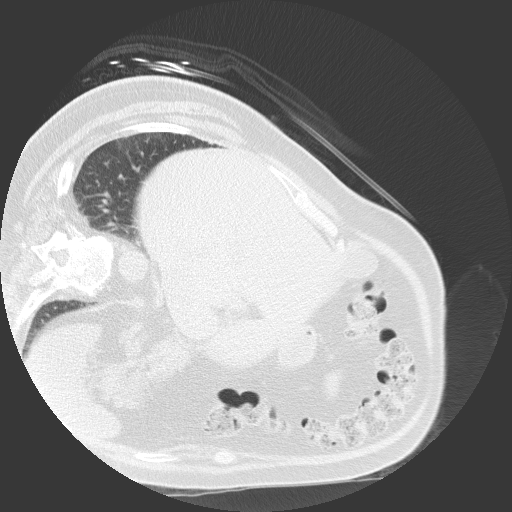

[13 of 32 positions shown; findings below may reference images not displayed]

EXAM:
CT-guided biopsy right renal mass

MEDICATIONS:
None.

ANESTHESIA/SEDATION:
Moderate (conscious) sedation was employed during this procedure. A
total of Versed 1.5 mg and Fentanyl 75 mcg was administered
intravenously by the radiology nurse.

Total intra-service moderate Sedation Time: 17 minutes. The
patient's level of consciousness and vital signs were monitored
continuously by radiology nursing throughout the procedure under my
direct supervision.

COMPLICATIONS:
NONE

PROCEDURE:
Informed written consent was obtained from the patient after a
thorough discussion of the procedural risks, benefits and
alternatives. All questions were addressed. Maximal Sterile Barrier
Technique was utilized including caps, mask, sterile gowns, sterile
gloves, sterile drape, hand hygiene and skin antiseptic. A timeout
was performed prior to the initiation of the procedure.

Patient was positioned left decubitus on the CT gantry table. Scout
CT was acquired for planning purposes.

The patient is prepped and draped in the usual sterile fashion. 1%
lidocaine was used for local anesthesia. CT guidance was used to
place introducer needle into the mass within the collecting system
of the right kidney. Once we confirmed needle tip position multiple
18 gauge core biopsy were acquired. Needle was removed and a final
image was acquired.

Patient tolerated the procedure well and remained hemodynamically
stable throughout.

No complications were encountered and no significant blood loss.

Patient tolerated the procedure well and remained hemodynamically
stable throughout.

No complications were encountered and no significant blood loss.
FINDINGS: Scout CT demonstrates stone at the ureteropelvic junction, new from
the comparison CT. No evidence of hydronephrosis.

Mass within the upper collecting system again demonstrated.

Images during the case demonstrate biopsy with no complicating
features.
IMPRESSION: Status post CT-guided biopsy of mass within the upper collecting
system of the right kidney.

## 2022-04-30 DIAGNOSIS — E785 Hyperlipidemia, unspecified: Secondary | ICD-10-CM | POA: Diagnosis not present

## 2022-04-30 DIAGNOSIS — Z0000 Encounter for general adult medical examination without abnormal findings: Secondary | ICD-10-CM | POA: Diagnosis not present

## 2022-04-30 DIAGNOSIS — Z6841 Body Mass Index (BMI) 40.0 and over, adult: Secondary | ICD-10-CM | POA: Diagnosis not present

## 2022-04-30 DIAGNOSIS — Z79899 Other long term (current) drug therapy: Secondary | ICD-10-CM | POA: Diagnosis not present

## 2022-06-11 DIAGNOSIS — H5203 Hypermetropia, bilateral: Secondary | ICD-10-CM | POA: Diagnosis not present

## 2022-06-11 DIAGNOSIS — H35711 Central serous chorioretinopathy, right eye: Secondary | ICD-10-CM | POA: Diagnosis not present

## 2022-06-11 DIAGNOSIS — H524 Presbyopia: Secondary | ICD-10-CM | POA: Diagnosis not present

## 2022-06-11 DIAGNOSIS — D3132 Benign neoplasm of left choroid: Secondary | ICD-10-CM | POA: Diagnosis not present

## 2022-06-11 DIAGNOSIS — H43813 Vitreous degeneration, bilateral: Secondary | ICD-10-CM | POA: Diagnosis not present

## 2022-06-11 DIAGNOSIS — H52203 Unspecified astigmatism, bilateral: Secondary | ICD-10-CM | POA: Diagnosis not present

## 2022-06-11 DIAGNOSIS — D3131 Benign neoplasm of right choroid: Secondary | ICD-10-CM | POA: Diagnosis not present

## 2022-06-11 DIAGNOSIS — H2513 Age-related nuclear cataract, bilateral: Secondary | ICD-10-CM | POA: Diagnosis not present

## 2022-06-11 DIAGNOSIS — H35361 Drusen (degenerative) of macula, right eye: Secondary | ICD-10-CM | POA: Diagnosis not present

## 2022-07-02 DIAGNOSIS — R918 Other nonspecific abnormal finding of lung field: Secondary | ICD-10-CM | POA: Diagnosis not present

## 2022-07-02 DIAGNOSIS — D3002 Benign neoplasm of left kidney: Secondary | ICD-10-CM | POA: Diagnosis not present

## 2022-07-02 DIAGNOSIS — K769 Liver disease, unspecified: Secondary | ICD-10-CM | POA: Diagnosis not present

## 2022-07-02 DIAGNOSIS — N289 Disorder of kidney and ureter, unspecified: Secondary | ICD-10-CM | POA: Diagnosis not present

## 2022-07-02 LAB — HEPATIC FUNCTION PANEL
ALT: 25 U/L (ref 10–40)
AST: 31 (ref 14–40)
Alkaline Phosphatase: 85 (ref 25–125)
Bilirubin, Total: 0.8

## 2022-07-02 LAB — BASIC METABOLIC PANEL
BUN: 27 — AB (ref 4–21)
CO2: 25 — AB (ref 13–22)
Chloride: 107 (ref 99–108)
Creatinine: 1 (ref 0.6–1.3)
Glucose: 89
Potassium: 4.5 mEq/L (ref 3.5–5.1)
Sodium: 140 (ref 137–147)

## 2022-07-02 LAB — CBC AND DIFFERENTIAL
HCT: 48 (ref 41–53)
Hemoglobin: 16.1 (ref 13.5–17.5)
Neutrophils Absolute: 6.55
Platelets: 200 10*3/uL (ref 150–400)
WBC: 8.5

## 2022-07-02 LAB — COMPREHENSIVE METABOLIC PANEL
Albumin: 4.5 (ref 3.5–5.0)
Calcium: 9.3 (ref 8.7–10.7)

## 2022-07-02 LAB — CBC: RBC: 5.12 — AB (ref 3.87–5.11)

## 2022-07-02 NOTE — Progress Notes (Signed)
[facility]  [address] [place],  [place]  [address] [phone]  Clinic Day:  07/03/2022  Referring physician: Angelina Sheriff, MD   HISTORY OF PRESENT ILLNESS:  The patient is a 72 y.o. male who I follow for biopsy-proven bilateral oncocytomas.  Scans have shown stable, bilateral renal masses.  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  He comes in today to go over his CT scans done yesterday to ensure his bilateral renal masses have not changed in size.  Since his last visit, the patient has been doing well.  He denies having any hematuria, dysuria, or costovertebral angle tenderness which concerns him for possible disease progression.  PHYSICAL EXAM:  Blood pressure (!) 168/100, pulse 86, temperature 99 F (37.2 C), resp. rate 18, height '6\' 3"'$  (1.905 m), weight (!) 326 lb 3.2 oz (148 kg), SpO2 97 %. Wt Readings from Last 3 Encounters:  07/03/22 (!) 326 lb 3.2 oz (148 kg)  01/01/22 (!) 349 lb 14.4 oz (158.7 kg)  10/01/21 (!) 355 lb (161 kg)   Body mass index is 40.77 kg/m. Performance status (ECOG): 1 - Symptomatic but completely ambulatory Physical Exam Constitutional:      Appearance: Normal appearance. He is not ill-appearing.  HENT:     Mouth/Throat:     Mouth: Mucous membranes are moist.     Pharynx: Oropharynx is clear. No oropharyngeal exudate or posterior oropharyngeal erythema.  Cardiovascular:     Rate and Rhythm: Normal rate and regular rhythm.     Heart sounds: No murmur heard.    No friction rub. No gallop.  Pulmonary:     Effort: Pulmonary effort is normal. No respiratory distress.     Breath sounds: Normal breath sounds. No wheezing, rhonchi or rales.  Abdominal:     General: Bowel sounds are normal. There is no distension.     Palpations: Abdomen is soft. There is no mass.     Tenderness: There is no abdominal tenderness.   Musculoskeletal:        General: No swelling.     Right lower leg: No edema.     Left lower leg: No edema.  Lymphadenopathy:     Cervical: No cervical adenopathy.     Upper Body:     Right upper body: No supraclavicular or axillary adenopathy.     Left upper body: No supraclavicular or axillary adenopathy.     Lower Body: No right inguinal adenopathy. No left inguinal adenopathy.  Skin:    General: Skin is warm.     Coloration: Skin is not jaundiced.     Findings: No lesion or rash.  Neurological:     General: No focal deficit present.     Mental Status: He is alert and oriented to person, place, and time. Mental status is at baseline.  Psychiatric:        Mood and Affect: Mood normal.        Behavior: Behavior normal.        Thought Content: Thought content normal.   SCANS: CT scans of his abdomen/pelvis revealed the following: FINDINGS: CT CHEST FINDINGS  Cardiovascular: Mild cardiomegaly. Normal caliber thoracic aorta with no significant atherosclerotic disease. No visible coronary artery calcifications.  Mediastinum/Nodes: Small hiatal hernia. Thyroid is unchanged in appearance. Calcified mediastinal and bilateral hilar lymph nodes, unchanged when compared with prior and likely sequela prior granulomatous infection.  Lungs/Pleura: Central airways are  patent. Stable calcified pulmonary nodule of the right upper lobe, measuring up to 1.5 cm on series 301, image 40. Numerous small perilymphatic pulmonary nodules again seen which are not significantly changed when compared with prior exam. No consolidation, pleural effusion or pneumothorax.  Musculoskeletal: Diffuse osseous heterogeneity.  CT ABDOMEN PELVIS FINDINGS  Hepatobiliary: Rim enhancing lesion of the right lobe of the liver in segment 5 measuring 3.8 x 3.1 cm on series 2 image 62, not significantly changed in size when compared with December 31, 2021 prior exam. Gallbladder is unremarkable. No biliary ductal  dilation.  Pancreas: Unremarkable. No pancreatic ductal dilatation or surrounding inflammatory changes.  Spleen: Normal in size without focal abnormality.  Adrenals/Urinary Tract: Bilateral adrenal glands are unremarkable. Numerous bilateral heterogeneously enhancing renal lesions are unchanged in size when compared with December 31, 2021 prior. Reference lesion of the medial right kidney measuring 6.5 x 4.5 cm. Reference lesion of the lower pole of the left kidney measuring 6.0 x 6.3 cm. No hydronephrosis. Bilateral nonobstructing renal stones.  Stomach/Bowel: Stomach is within normal limits. Appendix appears normal. No evidence of bowel wall thickening, distention, or inflammatory changes.  Vascular/Lymphatic: Aortic atherosclerosis. IVC filter is unchanged in position stable mildly enlarged celiac axis lymph node measuring 1.3 cm in short axis on series 2, image 60.  Reproductive: Penile implant with right lower quadrant reservoir. Prostate unremarkable.  Other: No abdominal wall hernia or abnormality. No abdominopelvic ascites.  Musculoskeletal: Intramedullary rod of the park small right femur. Diffuse osseous heterogeneity.  IMPRESSION: 1. Numerous bilateral heterogeneously enhancing renal lesions are unchanged in size when compared with December 31, 2021 prior exam consistent with metastases. 2. Rim enhancing lesion of the right lobe of the liver in segment V is not significantly changed in size when compared with December 31, 2021 prior exam. 3. Stable mildly enlarged celiac axis lymph node. 4. Diffuse osseous heterogeneity, which is similar to December 31, 2021 prior, but increased when compared with May 05, 2021 prior and likely due to osseous metastatic disease. 5. Numerous small perilymphatic pulmonary nodules and calcified mediastinal/hilar lymph nodes are not significantly changed when compared with prior exam, possibly sequela of granulomatous disease such as sarcoidosis.  Not entirely excluded.  LABS:      Latest Ref Rng & Units 07/02/2022   12:00 AM 10/01/2021    6:45 AM 07/02/2021   12:00 AM  CBC  WBC  8.5     6.6  7.9      Hemoglobin 13.5 - 17.5 16.1     15.3  16.0      Hematocrit 41 - 53 48     45.9  47      Platelets 150 - 400 K/uL 200     198  165         This result is from an external source.      Latest Ref Rng & Units 07/02/2022   12:00 AM 07/02/2021   12:00 AM 09/08/2013    4:10 AM  CMP  Glucose 70 - 99 mg/dL   112   BUN 4 - '21 27     20     25   '$ Creatinine 0.6 - 1.3 1.0     0.9     0.87   Sodium 137 - 147 140  C    141     139   Potassium 3.5 - 5.1 mEq/L 4.5     4.3     3.5   Chloride 99 - 108  107     107     96   CO2 13 - '22 25     25     29   '$ Calcium 8.7 - 10.7 9.3     9.0     8.5   Alkaline Phos 25 - 125 85     74       AST 14 - 40 31     25       ALT 10 - 40 U/L 25     22         C Corrected result   This result is from an external source.   PATHOLOGY: A. KIDNEY MASS, RIGHT, NEEDLE CORE BIOPSY (10-01-21): Oncocytic neoplasm. See comment.   Comment:  The biopsy is composed of nested, uniformly round cells with  eosinophilic cytoplasm in fibromyxoid stroma. Immunohistochemistry shows  positive for pax8, cd117, ck7 (patchy), negative for Gata3.  If this biopsy is representative of the entire lesion. It would be  consistent with an oncocytoma.   A. KIDNEY MASS, LEFT,  NEEDLE CORE BIOPSY (06-02-21):  - Compatible with renal oncocytoma.  - See comment.   COMMENT:   A. Core biopsy sections display cords and nests of oncocytic cells with  abundant eosinophilic cytoplasm, and round, regular nuclei.  Immunohistochemical studies show tumor cells to be positive for CD117  (c-kit) and negative for CK7, S100, CA-IX, and p504S (racemase).  These  findings support the above diagnosis.   ASSESSMENT & PLAN:  A 72 y.o. male with a biopsy-proven bilateral renal oncocytomas.  As what has been discussed with the patient previously, an  oncocytoma is usually not considered a malignant neoplasm.  It can potentially grow over time, but it rarely, if ever, metastasizes.  Once again, his CT scans showed that his bilateral renal masses remain stable in size.  The fact that these lesions have not grown suggest the potential for Birt-Hogg-Dub syndrome.  Overall, the patient is doing well.  He remains fine with his continued radiographic surveillance.  I will see him back in 6 months for repeat clinical assessment.  CT scans will be done a day before his next visit to ensure his bilateral renal masses have not enlarged in size.  The patient understands all the plans discussed today and is in agreement with them.   James Bribiesca Macarthur Critchley, MD

## 2022-07-03 ENCOUNTER — Inpatient Hospital Stay: Payer: Medicare HMO | Attending: Oncology | Admitting: Oncology

## 2022-07-03 ENCOUNTER — Other Ambulatory Visit: Payer: Self-pay | Admitting: Oncology

## 2022-07-03 ENCOUNTER — Telehealth: Payer: Self-pay | Admitting: Oncology

## 2022-07-03 VITALS — BP 168/100 | HR 86 | Temp 99.0°F | Resp 18 | Ht 75.0 in | Wt 326.2 lb

## 2022-07-03 DIAGNOSIS — N2889 Other specified disorders of kidney and ureter: Secondary | ICD-10-CM

## 2022-07-03 DIAGNOSIS — D3002 Benign neoplasm of left kidney: Secondary | ICD-10-CM

## 2022-07-03 NOTE — Telephone Encounter (Signed)
Patient has been scheduled for follow-up visit per 07/03/22 LOS.  Pt given an appt calendar with date and time.

## 2022-07-06 ENCOUNTER — Encounter: Payer: Self-pay | Admitting: Oncology

## 2022-07-28 DIAGNOSIS — L578 Other skin changes due to chronic exposure to nonionizing radiation: Secondary | ICD-10-CM | POA: Diagnosis not present

## 2022-07-28 DIAGNOSIS — W908XXS Exposure to other nonionizing radiation, sequela: Secondary | ICD-10-CM | POA: Diagnosis not present

## 2022-07-28 DIAGNOSIS — L570 Actinic keratosis: Secondary | ICD-10-CM | POA: Diagnosis not present

## 2022-07-28 DIAGNOSIS — D2372 Other benign neoplasm of skin of left lower limb, including hip: Secondary | ICD-10-CM | POA: Diagnosis not present

## 2022-07-28 DIAGNOSIS — D225 Melanocytic nevi of trunk: Secondary | ICD-10-CM | POA: Diagnosis not present

## 2022-08-05 DIAGNOSIS — K649 Unspecified hemorrhoids: Secondary | ICD-10-CM | POA: Diagnosis not present

## 2022-08-05 DIAGNOSIS — K5900 Constipation, unspecified: Secondary | ICD-10-CM | POA: Diagnosis not present

## 2022-08-05 DIAGNOSIS — D1803 Hemangioma of intra-abdominal structures: Secondary | ICD-10-CM | POA: Diagnosis not present

## 2022-08-14 DIAGNOSIS — Z0100 Encounter for examination of eyes and vision without abnormal findings: Secondary | ICD-10-CM | POA: Diagnosis not present

## 2022-09-01 DIAGNOSIS — I4891 Unspecified atrial fibrillation: Secondary | ICD-10-CM | POA: Diagnosis not present

## 2022-09-01 DIAGNOSIS — I10 Essential (primary) hypertension: Secondary | ICD-10-CM | POA: Diagnosis not present

## 2022-09-01 DIAGNOSIS — K644 Residual hemorrhoidal skin tags: Secondary | ICD-10-CM | POA: Diagnosis not present

## 2022-09-01 DIAGNOSIS — Z09 Encounter for follow-up examination after completed treatment for conditions other than malignant neoplasm: Secondary | ICD-10-CM | POA: Diagnosis not present

## 2022-09-01 DIAGNOSIS — Z1211 Encounter for screening for malignant neoplasm of colon: Secondary | ICD-10-CM | POA: Diagnosis not present

## 2022-09-01 DIAGNOSIS — Z86010 Personal history of colonic polyps: Secondary | ICD-10-CM | POA: Diagnosis not present

## 2022-10-29 DIAGNOSIS — Z6841 Body Mass Index (BMI) 40.0 and over, adult: Secondary | ICD-10-CM | POA: Diagnosis not present

## 2022-10-29 DIAGNOSIS — I10 Essential (primary) hypertension: Secondary | ICD-10-CM | POA: Diagnosis not present

## 2022-10-29 DIAGNOSIS — I700 Atherosclerosis of aorta: Secondary | ICD-10-CM | POA: Diagnosis not present

## 2022-11-16 DIAGNOSIS — I10 Essential (primary) hypertension: Secondary | ICD-10-CM | POA: Diagnosis not present

## 2022-11-16 DIAGNOSIS — Z6841 Body Mass Index (BMI) 40.0 and over, adult: Secondary | ICD-10-CM | POA: Diagnosis not present

## 2022-11-16 DIAGNOSIS — E782 Mixed hyperlipidemia: Secondary | ICD-10-CM | POA: Diagnosis not present

## 2022-11-16 DIAGNOSIS — I890 Lymphedema, not elsewhere classified: Secondary | ICD-10-CM | POA: Diagnosis not present

## 2023-01-04 DIAGNOSIS — N2889 Other specified disorders of kidney and ureter: Secondary | ICD-10-CM | POA: Diagnosis not present

## 2023-01-04 DIAGNOSIS — R160 Hepatomegaly, not elsewhere classified: Secondary | ICD-10-CM | POA: Diagnosis not present

## 2023-01-04 DIAGNOSIS — D3002 Benign neoplasm of left kidney: Secondary | ICD-10-CM | POA: Diagnosis not present

## 2023-01-04 DIAGNOSIS — N200 Calculus of kidney: Secondary | ICD-10-CM | POA: Diagnosis not present

## 2023-01-04 LAB — CBC AND DIFFERENTIAL
HCT: 45 (ref 41–53)
Hemoglobin: 15.4 (ref 13.5–17.5)
Neutrophils Absolute: 4.95
Platelets: 192 10*3/uL (ref 150–400)
WBC: 6.6

## 2023-01-04 LAB — BASIC METABOLIC PANEL
BUN: 21 (ref 4–21)
CO2: 26 — AB (ref 13–22)
Chloride: 104 (ref 99–108)
Creatinine: 1 (ref 0.6–1.3)
EGFR: 60
Glucose: 92
Potassium: 4.6 mEq/L (ref 3.5–5.1)
Sodium: 139 (ref 137–147)

## 2023-01-04 LAB — HEPATIC FUNCTION PANEL
ALT: 22 U/L (ref 10–40)
AST: 35 (ref 14–40)
Alkaline Phosphatase: 73 (ref 25–125)
Bilirubin, Total: 1.1

## 2023-01-04 LAB — CBC: RBC: 4.81 (ref 3.87–5.11)

## 2023-01-04 LAB — COMPREHENSIVE METABOLIC PANEL
Albumin: 4 (ref 3.5–5.0)
Calcium: 9.4 (ref 8.7–10.7)

## 2023-01-04 NOTE — Progress Notes (Unsigned)
[facility] [place]  [address] [place],  [place]  [address] [phone]  Clinic Day:  01/05/2023  Referring physician: Noni Saupe, MD   HISTORY OF PRESENT ILLNESS:  The patient is a 72 y.o. male who I follow for biopsy-proven bilateral oncocytomas.  Scans have shown stable, bilateral renal masses.  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  He comes in today to go over his CT scans done yesterday to ensure his bilateral renal masses have not changed in size.  Since his last visit, the patient has been doing well.  He denies having any hematuria, dysuria, or costovertebral angle tenderness which concerns him for possible disease progression.  PHYSICAL EXAM:  Blood pressure (!) 161/94, pulse 73, temperature 98.7 F (37.1 C), resp. rate 18, height 6\' 3"  (1.905 m), weight (!) 333 lb 8 oz (151.3 kg), SpO2 96 %. Wt Readings from Last 3 Encounters:  01/05/23 (!) 333 lb 8 oz (151.3 kg)  07/03/22 (!) 326 lb 3.2 oz (148 kg)  01/01/22 (!) 349 lb 14.4 oz (158.7 kg)   Body mass index is 41.68 kg/m. Performance status (ECOG): 1 - Symptomatic but completely ambulatory Physical Exam Constitutional:      Appearance: Normal appearance. He is not ill-appearing.  HENT:     Mouth/Throat:     Mouth: Mucous membranes are moist.     Pharynx: Oropharynx is clear. No oropharyngeal exudate or posterior oropharyngeal erythema.  Cardiovascular:     Rate and Rhythm: Normal rate and regular rhythm.     Heart sounds: No murmur heard.    No friction rub. No gallop.  Pulmonary:     Effort: Pulmonary effort is normal. No respiratory distress.     Breath sounds: Normal breath sounds. No wheezing, rhonchi or rales.  Abdominal:     General: Bowel sounds are normal. There is no distension.     Palpations: Abdomen is soft. There is no mass.     Tenderness: There is no abdominal  tenderness.  Musculoskeletal:        General: No swelling.     Right lower leg: No edema.     Left lower leg: No edema.  Lymphadenopathy:     Cervical: No cervical adenopathy.     Upper Body:     Right upper body: No supraclavicular or axillary adenopathy.     Left upper body: No supraclavicular or axillary adenopathy.     Lower Body: No right inguinal adenopathy. No left inguinal adenopathy.  Skin:    General: Skin is warm.     Coloration: Skin is not jaundiced.     Findings: No lesion or rash.  Neurological:     General: No focal deficit present.     Mental Status: He is alert and oriented to person, place, and time. Mental status is at baseline.  Psychiatric:        Mood and Affect: Mood normal.        Behavior: Behavior normal.        Thought Content: Thought content normal.    SCANS: CT scans of his abdomen/pelvis revealed the following: FINDINGS: Lower Chest: No acute findings.  Hepatobiliary: Hypervascular mass with central low attenuation is again seen in the anterior segment of the right hepatic lobe. This measures 3.9 x 2.9 cm on image 22/2, without significant change since prior study. No other hepatic masses are identified. Gallbladder is unremarkable. No evidence  of biliary ductal dilatation.  Pancreas: No mass or inflammatory changes.  Spleen: Within normal limits in size and appearance.  Adrenals/Urinary Tract: 2.1 cm fat attenuation left adrenal mass is again seen, consistent with benign myelolipoma (No followup imaging is recommended). A few renal calculi are again seen bilaterally. A new 11 mm calculus is seen at the right UPJ, however there is no hydronephrosis.  Multiple heterogeneously enhancing renal masses are again seen in both kidneys, largest in the left kidney measuring 6.3 cm, and 5.8 cm in the right kidney. These show no significant change since previous study. Unremarkable unopacified urinary bladder.  Stomach/Bowel: Stable postop  changes from previous right colectomy. No evidence of obstruction, inflammatory process or abnormal fluid collections.  Vascular/Lymphatic: Stable mildly enlarged lymph nodes in the gastrohepatic ligament measuring up to 13 mm, and the porta hepatis measuring up 12 mm. Sub-centimeter paraaortic lymph nodes remains stable. No pelvic lymphadenopathy identified. IVC filter remains in appropriate position. Aortic atherosclerotic calcification incidentally noted. No acute vascular findings.  Reproductive: Penile prosthesis again noted. No mass or other significant abnormality.  Other: None.  Musculoskeletal: No suspicious bone lesions identified. Internal fixation hardware again seen in the right hip.  IMPRESSION: Stable right hepatic lobe hypervascular mass and bilateral renal masses, consistent with metastatic disease.  Stable mild lymphadenopathy in the gastrohepatic ligament and porta hepatis, suspicious for metastatic disease.  No No evidence of new or progressive metastatic disease.  New 11 mm calculus at right UPJ, without hydronephrosis. Bilateral nephrolithiasis.  Aortic Atherosclerosis (ICD10-I70.0).  LABS:      Latest Ref Rng & Units 01/04/2023   12:00 AM 07/02/2022   12:00 AM 10/01/2021    6:45 AM  CBC  WBC  6.6     8.5     6.6   Hemoglobin 13.5 - 17.5 15.4     16.1     15.3   Hematocrit 41 - 53 45     48     45.9   Platelets 150 - 400 K/uL 192     200     198      This result is from an external source.      Latest Ref Rng & Units 01/04/2023   12:00 AM 07/02/2022   12:00 AM 07/02/2021   12:00 AM  CMP  BUN 4 - 21 21     27     20       Creatinine 0.6 - 1.3 1.0     1.0     0.9      Sodium 137 - 147 139     140  C    141      Potassium 3.5 - 5.1 mEq/L 4.6     4.5     4.3      Chloride 99 - 108 104     107     107      CO2 13 - 22 26     25     25       Calcium 8.7 - 10.7 9.4     9.3     9.0      Alkaline Phos 25 - 125 73     85     74      AST 14 - 40 35     31      25      ALT 10 - 40 U/L 22     25     22  C Corrected result   This result is from an external source.   PATHOLOGY: A. KIDNEY MASS, RIGHT, NEEDLE CORE BIOPSY (10-01-21): Oncocytic neoplasm. See comment.   Comment:  The biopsy is composed of nested, uniformly round cells with  eosinophilic cytoplasm in fibromyxoid stroma. Immunohistochemistry shows  positive for pax8, cd117, ck7 (patchy), negative for Gata3.  If this biopsy is representative of the entire lesion. It would be  consistent with an oncocytoma.   A. KIDNEY MASS, LEFT,  NEEDLE CORE BIOPSY (06-02-21):  - Compatible with renal oncocytoma.  - See comment.   COMMENT:   A. Core biopsy sections display cords and nests of oncocytic cells with  abundant eosinophilic cytoplasm, and round, regular nuclei.  Immunohistochemical studies show tumor cells to be positive for CD117  (c-kit) and negative for CK7, S100, CA-IX, and p504S (racemase).  These  findings support the above diagnosis.   His liver biopsy from October 2022 revealed the following:   ASSESSMENT & PLAN:  A 72 y.o. male with a biopsy-proven bilateral renal oncocytomas.  In clinic today, I went over his CT scan images with him, for which he could see that his bilateral renal masses remain stable in size.  His liver lesion is also stable.  Although the report claims "metastatic disease,"  his liver lesion was biopsy proven to be a hemangioma.  As what has been discussed with the patient previously, an oncocytoma is usually not considered a malignant neoplasm.  It can potentially grow over time, but it rarely, if ever, metastasizes.  Once again, his CT scans showed that his bilateral renal masses remain stable in size.  This gentleman potentially has Birt-Hogg-Dub syndrome, for which genetic counseling could be discussed.  Overall, the patient is doing well.  I will see him back in 1 year for repeat clinical assessment.  CT scans will be done a day before his next visit  to ensure his bilateral renal masses have not enlarged in size.  The patient understands all the plans discussed today and is in agreement with them.  Carla Rashad Kirby Funk, MD

## 2023-01-05 ENCOUNTER — Inpatient Hospital Stay: Payer: Medicare HMO | Attending: Oncology | Admitting: Oncology

## 2023-01-05 ENCOUNTER — Other Ambulatory Visit: Payer: Self-pay | Admitting: Oncology

## 2023-01-05 VITALS — BP 161/94 | HR 73 | Temp 98.7°F | Resp 18 | Ht 75.0 in | Wt 333.5 lb

## 2023-01-05 DIAGNOSIS — D3002 Benign neoplasm of left kidney: Secondary | ICD-10-CM

## 2023-01-05 DIAGNOSIS — N2889 Other specified disorders of kidney and ureter: Secondary | ICD-10-CM

## 2023-01-19 ENCOUNTER — Encounter: Payer: Self-pay | Admitting: Oncology

## 2023-01-26 DIAGNOSIS — L570 Actinic keratosis: Secondary | ICD-10-CM | POA: Diagnosis not present

## 2023-01-26 DIAGNOSIS — D2372 Other benign neoplasm of skin of left lower limb, including hip: Secondary | ICD-10-CM | POA: Diagnosis not present

## 2023-01-26 DIAGNOSIS — D225 Melanocytic nevi of trunk: Secondary | ICD-10-CM | POA: Diagnosis not present

## 2023-01-26 DIAGNOSIS — Z86018 Personal history of other benign neoplasm: Secondary | ICD-10-CM | POA: Diagnosis not present

## 2023-01-26 DIAGNOSIS — L821 Other seborrheic keratosis: Secondary | ICD-10-CM | POA: Diagnosis not present

## 2023-01-26 DIAGNOSIS — D235 Other benign neoplasm of skin of trunk: Secondary | ICD-10-CM | POA: Diagnosis not present

## 2023-01-26 DIAGNOSIS — D485 Neoplasm of uncertain behavior of skin: Secondary | ICD-10-CM | POA: Diagnosis not present

## 2023-02-10 DIAGNOSIS — I499 Cardiac arrhythmia, unspecified: Secondary | ICD-10-CM | POA: Diagnosis not present

## 2023-02-10 DIAGNOSIS — I4891 Unspecified atrial fibrillation: Secondary | ICD-10-CM | POA: Diagnosis not present

## 2023-02-10 DIAGNOSIS — I482 Chronic atrial fibrillation, unspecified: Secondary | ICD-10-CM | POA: Diagnosis not present

## 2023-03-10 DIAGNOSIS — M2042 Other hammer toe(s) (acquired), left foot: Secondary | ICD-10-CM | POA: Diagnosis not present

## 2023-03-10 DIAGNOSIS — L84 Corns and callosities: Secondary | ICD-10-CM | POA: Diagnosis not present

## 2023-03-24 DIAGNOSIS — M2042 Other hammer toe(s) (acquired), left foot: Secondary | ICD-10-CM | POA: Diagnosis not present

## 2023-03-24 DIAGNOSIS — L84 Corns and callosities: Secondary | ICD-10-CM | POA: Insufficient documentation

## 2023-04-07 DIAGNOSIS — Z23 Encounter for immunization: Secondary | ICD-10-CM | POA: Diagnosis not present

## 2023-05-19 DIAGNOSIS — L84 Corns and callosities: Secondary | ICD-10-CM | POA: Diagnosis not present

## 2023-05-19 DIAGNOSIS — M21611 Bunion of right foot: Secondary | ICD-10-CM | POA: Diagnosis not present

## 2023-05-19 DIAGNOSIS — M2042 Other hammer toe(s) (acquired), left foot: Secondary | ICD-10-CM | POA: Diagnosis not present

## 2023-05-19 DIAGNOSIS — M21612 Bunion of left foot: Secondary | ICD-10-CM | POA: Diagnosis not present

## 2023-05-20 ENCOUNTER — Ambulatory Visit: Payer: Medicare HMO

## 2023-05-20 VITALS — BP 148/82 | HR 86 | Temp 98.0°F | Resp 16 | Ht 75.0 in | Wt 328.6 lb

## 2023-05-20 DIAGNOSIS — I10 Essential (primary) hypertension: Secondary | ICD-10-CM | POA: Diagnosis not present

## 2023-05-20 DIAGNOSIS — E7800 Pure hypercholesterolemia, unspecified: Secondary | ICD-10-CM

## 2023-05-20 DIAGNOSIS — I4891 Unspecified atrial fibrillation: Secondary | ICD-10-CM

## 2023-05-20 DIAGNOSIS — E66813 Obesity, class 3: Secondary | ICD-10-CM

## 2023-05-20 DIAGNOSIS — E0520 Thyrotoxicosis with toxic multinodular goiter without thyrotoxic crisis or storm: Secondary | ICD-10-CM | POA: Diagnosis not present

## 2023-05-20 DIAGNOSIS — Z6841 Body Mass Index (BMI) 40.0 and over, adult: Secondary | ICD-10-CM | POA: Diagnosis not present

## 2023-05-20 DIAGNOSIS — Z0000 Encounter for general adult medical examination without abnormal findings: Secondary | ICD-10-CM

## 2023-05-20 MED ORDER — LISINOPRIL 10 MG PO TABS
40.0000 mg | ORAL_TABLET | Freq: Every evening | ORAL | 1 refills | Status: DC
Start: 2023-05-20 — End: 2023-09-10

## 2023-05-20 NOTE — Assessment & Plan Note (Signed)
General Health Maintenance Up to date with pneumonia vaccines, flu shot, and shingles vaccine. Last colonoscopy in March 2024 was normal with no polyps. Regular follow-ups with a dermatologist. Discussed the importance of regular screenings and vaccinations. - Schedule next colonoscopy in 7-10 years as per previous recommendation - Continue regular dermatology follow-ups Follow-up - Schedule follow-up visit in January 2025 - Order blood work in January 2025 - Refill all necessary medications in January 2025.

## 2023-05-20 NOTE — Assessment & Plan Note (Addendum)
Atrial fibrillation WITH CONTROLLED VENTRICULAR RESPONSE, managed with diltiazem 180 mg daily and Xarelto 20 mg daily. No recent episodes reported. Adheres to medication regimen. Discussed the benefits of Xarelto in preventing stroke and the importance of adherence. - Continue diltiazem 180 mg daily - Continue Xarelto 20 mg daily   pulmonary Venous thromboembolism post-2015 accident, managed with Xarelto 20 mg daily and compression stockings. Discussed the importance of continued use of anticoagulants and compression stockings to prevent recurrence. - Continue Xarelto 20 mg daily - Continue use of compression stockings

## 2023-05-20 NOTE — Progress Notes (Signed)
Established Patient Office Visit  Subjective   Patient ID: James Schaefer, male    DOB: 04-Jan-1951  Age: 72 y.o. MRN: [id number]  Chief Complaint  Patient presents with   Establish Care    HPI The patient, previously under the care of Dr. Jeanie Sewer for 33 years, presents for an initial consultation after his previous physician ceased practicing.   The patient has a history of elevated cholesterol, managed with pravastatin 40mg  daily, and hypertension, managed with lisinopril 40mg  daily, hydralazine 25mg  three times daily, and diltiazem 180mg  daily. The patient also has a history of atrial fibrillation, for which he takes Xarelto 20mg  daily. The patient reports adherence to his medication regimen and checks his blood pressure regularly at home, which typically ranges from the upper 130s to low 140s systolic and around 80 to 85 diastolic. He reports a low-sodium diet and infrequent dining out, approximately once or twice a week. The patient does not engage in regular exercise due to physical limitations following a significant accident in 2015, which resulted in a broken right femur in two places and necessitated the use of a cane and a lift in his right shoe.   In 2015, the patient was also diagnosed with a hyperactive thyroid, which was treated with radiation therapy. More recently, the patient underwent a series of tests over a period of six to nine months due to concerns of potential cancerous growths in the kidneys, liver, and lungs. However, these were eventually determined to be scar tissue.   The patient also had a colonoscopy in March 2024, which was normal. The patient is a Technical sales engineer and enjoys an active lifestyle, including regular RV camping. He reports no current pain or discomfort.   Patient Active Problem List   Diagnosis Date Noted   Class 3 severe obesity due to excess calories with serious comorbidity and body mass index (BMI) of 40.0 to 44.9 in adult [facility]) 05/20/2023    Healthcare maintenance 05/20/2023   Renal oncocytoma of left kidney 07/02/2021   History of pulmonary embolism 11/29/2019   Pulmonary embolus (HCC) 12/23/2014   Ileus, postoperative (HCC) 09/06/2013   Hip fracture, intertrochanteric (HCC) 09/03/2013   Hip fracture (HCC) 09/03/2013   GOITER, MULTINODULAR 04/21/2010   Thyrotoxicosis 04/21/2010   HYPERCHOLESTEROLEMIA 04/21/2010   Essential hypertension 04/21/2010   ATRIAL FIBRILLATION 04/21/2010   ERECTILE DYSFUNCTION, ORGANIC 04/21/2010   Osteoarthritis 04/21/2010   Past Medical History:  Diagnosis Date   Hypercholesteremia    Hypertension       Review of Systems  Constitutional: Negative.   HENT: Negative.    Eyes: Negative.   Respiratory: Negative.    Cardiovascular: Negative.   Gastrointestinal: Negative.   Genitourinary: Negative.   Musculoskeletal:  Positive for joint pain (right leg).  Skin: Negative.   Neurological: Negative.   Psychiatric/Behavioral: Negative.        Objective:     BP (!) 148/82 (BP Location: Left Arm, Patient Position: Sitting, Cuff Size: Large)   Pulse 86   Temp 98 F (36.7 C) (Temporal)   Resp 16   Ht 6\' 3"  (1.905 m)   Wt (!) 328 lb 9.6 oz (149.1 kg)   SpO2 98%   BMI 41.07 kg/m     Physical Exam Vitals and nursing note reviewed.  Constitutional:      Appearance: Normal appearance.  HENT:     Head: Normocephalic and atraumatic.  Cardiovascular:     Rate and Rhythm: Normal rate. Rhythm irregular.     Comments:  No edema in legs; right leg has a lift of approximately 1 and 1/8 inches. Pulmonary:     Effort: Pulmonary effort is normal.     Breath sounds: Normal breath sounds.  Musculoskeletal:     Cervical back: Normal range of motion.     Comments: Walks with a limp from limb length discrepancy  Neurological:     General: No focal deficit present.     Mental Status: He is alert.  Psychiatric:        Mood and Affect: Mood normal.        Behavior: Behavior normal.       No results found for any visits on 05/20/23.  Last metabolic panel Lab Results  Component Value Date   GLUCOSE 112 (H) 09/08/2013   NA 139 01/04/2023   K 4.6 01/04/2023   CL 104 01/04/2023   CO2 26 (A) 01/04/2023   BUN 21 01/04/2023   CREATININE 1.0 01/04/2023   EGFR 60.0 01/04/2023   CALCIUM 9.4 01/04/2023   PROT 6.3 09/04/2013   ALBUMIN 4.0 01/04/2023   BILITOT 0.7 09/04/2013   ALKPHOS 73 01/04/2023   AST 35 01/04/2023   ALT 22 01/04/2023   Last lipids No results found for: "CHOL", "HDL", "LDLCALC", "LDLDIRECT", "TRIG", "CHOLHDL" Last hemoglobin A1c No results found for: "HGBA1C" Last thyroid functions Lab Results  Component Value Date   TSH 6.076 (H) 09/04/2013   Last vitamin D Lab Results  Component Value Date   VD25OH 44 09/04/2013   Last vitamin B12 and Folate No results found for: "VITAMINB12", "FOLATE"    The ASCVD Risk score (Arnett DK, et al., 2019) failed to calculate for the following reasons:   Cannot find a previous HDL lab   Cannot find a previous total cholesterol lab    Assessment & Plan:   Problem List Items Addressed This Visit     Thyrotoxicosis    Unclear if he had multinodular goiter or Thyroid cancer, but he was reportedly treated with radiation, no recurrence noted.  Regular follow-ups show no signs of cancer.   Discussed the importance of regular monitoring for recurrence. - Continue regular follow-ups as needed        HYPERCHOLESTEROLEMIA - Primary    Hyperlipidemia managed with pravastatin 40 mg daily. Cholesterol levels are borderline but controlled with current medication. Discussed the benefits of pravastatin in reducing cardiovascular risk and inflammation. - Refill pravastatin 40 mg at [facility] - Continue pravastatin 40 mg daily        Relevant Medications   lisinopril (ZESTRIL) 10 MG tablet   Essential hypertension    Hypertension managed with lisinopril 40 mg daily, hydralazine 25 mg three times daily, and  diltiazem 180 mg daily.  Blood pressure today is slightly elevated, typically ranges from upper 130s to low 140s systolic and 80-85 diastolic. Monitors blood pressure at home and adheres to dietary salt restrictions.   Discussed the importance of regular exercise and continued dietary salt reduction. - Refill lisinopril 40 mg at [facility] - Continue current antihypertensive regimen - Encourage regular monitoring of blood pressure at home - Encourage regular exercise and dietary salt reduction       Relevant Medications   lisinopril (ZESTRIL) 10 MG tablet   ATRIAL FIBRILLATION    Atrial fibrillation WITH CONTROLLED VENTRICULAR RESPONSE, managed with diltiazem 180 mg daily and Xarelto 20 mg daily. No recent episodes reported. Adheres to medication regimen. Discussed the benefits of Xarelto in preventing stroke and the importance of adherence. -  Continue diltiazem 180 mg daily - Continue Xarelto 20 mg daily   pulmonary Venous thromboembolism post-2015 accident, managed with Xarelto 20 mg daily and compression stockings. Discussed the importance of continued use of anticoagulants and compression stockings to prevent recurrence. - Continue Xarelto 20 mg daily - Continue use of compression stockings       Relevant Medications   lisinopril (ZESTRIL) 10 MG tablet   Class 3 severe obesity due to excess calories with serious comorbidity and body mass index (BMI) of 40.0 to 44.9 in adult [facility])   Healthcare maintenance    General Health Maintenance Up to date with pneumonia vaccines, flu shot, and shingles vaccine. Last colonoscopy in March 2024 was normal with no polyps. Regular follow-ups with a dermatologist. Discussed the importance of regular screenings and vaccinations. - Schedule next colonoscopy in 7-10 years as per previous recommendation - Continue regular dermatology follow-ups Follow-up - Schedule follow-up visit in January 2025 - Order blood work in January 2025 - Refill all necessary  medications in January 2025.        Return in about 2 months (around 07/20/2023) for chronic disease follow up, wellness exam.    Windell Moment, MD

## 2023-05-20 NOTE — Assessment & Plan Note (Signed)
Hyperlipidemia managed with pravastatin 40 mg daily. Cholesterol levels are borderline but controlled with current medication. Discussed the benefits of pravastatin in reducing cardiovascular risk and inflammation. - Refill pravastatin 40 mg at [facility] - Continue pravastatin 40 mg daily

## 2023-05-20 NOTE — Assessment & Plan Note (Signed)
Unclear if he had multinodular goiter or Thyroid cancer, but he was reportedly treated with radiation, no recurrence noted.  Regular follow-ups show no signs of cancer.   Discussed the importance of regular monitoring for recurrence. - Continue regular follow-ups as needed

## 2023-05-20 NOTE — Patient Instructions (Signed)
Refilled your lisinopril Will review your records from before When you come back in January 2025, we will do blood work Call us when you need more refills.

## 2023-05-20 NOTE — Assessment & Plan Note (Signed)
Hypertension managed with lisinopril 40 mg daily, hydralazine 25 mg three times daily, and diltiazem 180 mg daily.  Blood pressure today is slightly elevated, typically ranges from upper 130s to low 140s systolic and 80-85 diastolic. Monitors blood pressure at home and adheres to dietary salt restrictions.   Discussed the importance of regular exercise and continued dietary salt reduction. - Refill lisinopril 40 mg at [facility] - Continue current antihypertensive regimen - Encourage regular monitoring of blood pressure at home - Encourage regular exercise and dietary salt reduction

## 2023-06-14 ENCOUNTER — Ambulatory Visit (INDEPENDENT_AMBULATORY_CARE_PROVIDER_SITE_OTHER): Payer: Medicare HMO

## 2023-06-14 VITALS — BP 138/80 | HR 64 | Temp 97.6°F | Resp 16 | Ht 75.0 in | Wt 328.4 lb

## 2023-06-14 DIAGNOSIS — D3002 Benign neoplasm of left kidney: Secondary | ICD-10-CM

## 2023-06-14 DIAGNOSIS — I10 Essential (primary) hypertension: Secondary | ICD-10-CM

## 2023-06-14 DIAGNOSIS — Z0000 Encounter for general adult medical examination without abnormal findings: Secondary | ICD-10-CM | POA: Insufficient documentation

## 2023-06-14 DIAGNOSIS — I4891 Unspecified atrial fibrillation: Secondary | ICD-10-CM

## 2023-06-14 DIAGNOSIS — E7800 Pure hypercholesterolemia, unspecified: Secondary | ICD-10-CM

## 2023-06-14 DIAGNOSIS — K5904 Chronic idiopathic constipation: Secondary | ICD-10-CM | POA: Diagnosis not present

## 2023-06-14 MED ORDER — PRAVASTATIN SODIUM 40 MG PO TABS
40.0000 mg | ORAL_TABLET | Freq: Every evening | ORAL | 1 refills | Status: DC
Start: 2023-06-14 — End: 2023-09-10

## 2023-06-14 NOTE — Assessment & Plan Note (Signed)
Up to date with vaccinations including flu, pneumonia, shingles, and COVID-19. No issues with vision, hearing, or activities of daily living. No falls or cognitive issues. Quit smoking in 1980 and drinks alcohol occasionally. Discussed the importance of regular exercise and gradual weight loss. - Encourage regular exercise - Monitor weight and aim for gradual weight loss - Order metabolic profile - Schedule follow-up in six months  Follow-up - Schedule follow-up appointment in six months - Order blood work including metabolic profile and lipid panel.

## 2023-06-14 NOTE — Assessment & Plan Note (Signed)
Reports a change in bowel habits over the past 3-4 months, from daily bowel movements to every 2-3 days. Recent colonoscopy in March was normal, ruling out mechanical obstruction. No blood in stools, diarrhea, black stools, or abdominal pain reported. Possible contributing factors include insufficient water intake, low dietary fiber, and lack of exercise. Discussed that normal bowel habits can vary widely among individuals, and deviations from personal norms are concerning. - Increase water intake to 80-90 ounces per day - Increase dietary fiber intake - Engage in regular exercise - Continue using laxatives or stool softeners as needed - Reassess if symptoms persist

## 2023-06-14 NOTE — Patient Instructions (Addendum)
VISIT SUMMARY:  You came in today for a wellness exam. We discussed your recent change in bowel habits, your history of renal cancer, and your ongoing management of hypertension, hyperlipidemia, atrial fibrillation, and pulmonary embolism. We also talked about your need for increased exercise and weight loss.  YOUR PLAN:  -CONSTIPATION: Constipation means having fewer bowel movements than usual. Your recent colonoscopy was normal, so we ruled out any serious issues. To help manage this, increase your water intake to 80-90 ounces per day, eat more dietary fiber, engage in regular exercise, and continue using laxatives or stool softeners as needed. We will reassess if symptoms persist.  -HYPERTENSION: Hypertension is high blood pressure. Your readings have been mostly normal with occasional slight elevations. Continue taking your current medications (hydralazine 25 mg three times daily and lisinopril 10 mg daily) and monitor your blood pressure regularly.  -HYPERLIPIDEMIA: Hyperlipidemia means having high levels of fats (lipids) in your blood. Your last blood work showed good liver function and lipid levels. Continue taking pravastatin 40 mg daily, and we will order a lipid panel to check your levels.  -ATRIAL FIBRILLATION: Atrial fibrillation is an irregular and often rapid heart rate. You are currently taking Xarelto 20 mg daily to prevent stroke and other complications. Continue this medication and schedule your annual cardiology follow-up.  -PULMONARY EMBOLISM: A pulmonary embolism is a blockage in one of the pulmonary arteries in your lungs. You are on Xarelto 20 mg daily to prevent recurrence. Continue this medication.  -RENAL ONCOCYTOMA: Renal oncocytoma is a type of kidney tumor that is currently non-cancerous but can become malignant. Continue your annual follow-up with your oncologist.  -GENERAL HEALTH MAINTENANCE: You are up to date with your vaccinations and have no issues with vision,  hearing, or daily activities. You quit smoking in 1980 and drink alcohol occasionally. We discussed the importance of regular exercise and gradual weight loss. We will order a metabolic profile and schedule a follow-up in six months.  INSTRUCTIONS:  Please schedule a follow-up appointment in six months. We will also need to order blood work, including a metabolic profile and lipid panel.

## 2023-06-14 NOTE — Assessment & Plan Note (Signed)
Hyperlipidemia managed with pravastatin 40 mg daily. Cholesterol levels are borderline but controlled with current medication. Discussed the benefits of pravastatin in reducing cardiovascular risk and inflammation.

## 2023-06-14 NOTE — Assessment & Plan Note (Signed)
On Xarelto 20 mg daily. No current issues reported. Discussed the importance of continuing anticoagulation therapy to prevent stroke and other complications. - Continue Xarelto 20 mg daily - Schedule annual cardiology follow-up

## 2023-06-14 NOTE — Assessment & Plan Note (Signed)
Blood pressure readings over the past month are generally well-controlled with occasional readings in the low 140s. On hydralazine 25 mg three times daily and lisinopril 10 mg daily. - Continue current antihypertensive medications - Monitor blood pressure regularly

## 2023-06-14 NOTE — Progress Notes (Signed)
Annual Wellness Visit     Patient: James Schaefer, Male    DOB: 04-Apr-1951, 72 y.o.   MRN: [id number]  Subjective  Chief Complaint  Patient presents with   Annual Exam    James Schaefer is a 72 y.o. male who presents today for his Annual Wellness Visit AND FOLLOW UP OF CHRONIC MEDICAL PROBLEMS. He reports consuming a general diet. The patient does not participate in regular exercise at present. He generally feels well. He reports sleeping well. He does not have additional problems to discuss today.   HPI  Vision:Within last year and Dental: No current dental problems and Last dental visit: 2024  The patient, with a history of hypertension, hyperlipidemia, atrial fibrillation, and renal oncocytoma, presents for a wellness exam. He reports a significant change in bowel habits over the past three to four months, shifting from daily bowel movements to every two to three days. He denies any associated abdominal pain, diarrhea, or blood in the stool. He has been managing this change with weekly laxatives, which have been effective.  The patient also mentions a history of renal cancer, with tumors that are currently non-cancerous but have the potential to become malignant. He has been seeing an oncologist annually for this condition.  The patient acknowledges a need for increased exercise and weight loss. He reports good sleep, no difficulties with daily activities or medication management, and no recent falls. He quit smoking in 1980 and consumes alcohol occasionally.  The patient's blood pressure readings over the past month have been mostly within normal limits, with a few readings slightly elevated. He has been wearing compression stockings for slight swelling in the left leg.   Patient Active Problem List   Diagnosis Date Noted   Encounter for Medicare annual wellness exam 06/14/2023   Functional constipation 06/14/2023   Class 3 severe obesity due to excess calories with  serious comorbidity and body mass index (BMI) of 40.0 to 44.9 in adult [facility]) 05/20/2023   Healthcare maintenance 05/20/2023   Renal oncocytoma of left kidney 07/02/2021   History of pulmonary embolism 11/29/2019   Pulmonary embolus (HCC) 12/23/2014   Ileus, postoperative (HCC) 09/06/2013   Hip fracture, intertrochanteric (HCC) 09/03/2013   Hip fracture (HCC) 09/03/2013   GOITER, MULTINODULAR 04/21/2010   Thyrotoxicosis 04/21/2010   HYPERCHOLESTEROLEMIA 04/21/2010   Essential hypertension 04/21/2010   ATRIAL FIBRILLATION 04/21/2010   ERECTILE DYSFUNCTION, ORGANIC 04/21/2010   Osteoarthritis 04/21/2010   Past Medical History:  Diagnosis Date   Hypercholesteremia    Hypertension    Past Surgical History:  Procedure Laterality Date   HERNIA REPAIR     INTRAMEDULLARY (IM) NAIL INTERTROCHANTERIC Right 09/04/2013   Procedure: INTRAMEDULLARY (IM) NAIL INTERTROCHANTRIC;  Surgeon: Thera Flake., MD;  Location: [facility] OR;  Service: Orthopedics;  Laterality: Right;   knee replacements     penile pump     Social History   Socioeconomic History   Marital status: Married    Spouse name: [name]   Number of children: 2   Years of education: 12 + SOME COLLEGE   Highest education level: Not on file  Occupational History   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  Tobacco Use   Smoking status: Former    Current packs/day: 0.00    Types: Cigarettes    Quit date: 09/04/1978    Years since quitting: 44.8   Smokeless tobacco: Former    Quit date: 09/04/1978  Vaping Use   Vaping status: Never Used  Substance and  Sexual Activity   Alcohol use: Yes    Comment: occassional   Drug use: No   Sexual activity: Not Currently  Other Topics Concern   Not on file  Social History Narrative   Not on file   Social Drivers of Health   Financial Resource Strain: Low Risk  (05/20/2023)   Overall Financial Resource Strain (CARDIA)    Difficulty of Paying Living Expenses: Not hard at all  Food Insecurity: No Food  Insecurity (05/20/2023)   Hunger Vital Sign    Worried About Running Out of Food in the Last Year: Never true    Ran Out of Food in the Last Year: Never true  Transportation Needs: No Transportation Needs (05/20/2023)   PRAPARE - Administrator, Civil Service (Medical): No    Lack of Transportation (Non-Medical): No  Physical Activity: Insufficiently Active (05/20/2023)   Exercise Vital Sign    Days of Exercise per Week: 1 day    Minutes of Exercise per Session: 30 min  Stress: No Stress Concern Present (05/20/2023)   [employer] of Occupational Health - Occupational Stress Questionnaire    Feeling of Stress : Not at all  Social Connections: Socially Integrated (05/20/2023)   Social Connection and Isolation Panel [NHANES]    Frequency of Communication with Friends and Family: More than three times a week    Frequency of Social Gatherings with Friends and Family: More than three times a week    Attends Religious Services: 1 to 4 times per year    Active Member of [employer] or Organizations: Yes    Attends Engineer, structural: More than 4 times per year    Marital Status: Married  Catering manager Violence: Not At Risk (05/20/2023)   Humiliation, Afraid, Rape, and Kick questionnaire    Fear of Current or Ex-Partner: No    Emotionally Abused: No    Physically Abused: No    Sexually Abused: No   Family History  Problem Relation Age of Onset   Dementia Mother    CAD Father    Kidney failure Father    Asthma Sister    Pancreatic cancer Maternal Uncle    Allergies  Allergen Reactions   Pedi-Pre Tape Spray [Wound Dressing Adhesive] Rash      Medications: Outpatient Medications Prior to Visit  Medication Sig   ascorbic acid (VITAMIN C) 500 MG tablet Take 500 mg by mouth daily.   diltiazem (TIAZAC) 180 MG 24 hr capsule Take 180 mg by mouth daily.   hydrALAZINE (APRESOLINE) 25 MG tablet Take 25 mg by mouth 3 (three) times daily.   lisinopril (ZESTRIL) 10  MG tablet Take 4 tablets (40 mg total) by mouth every evening.   Multiple Vitamin (MULTIVITAMIN) capsule Take 1 capsule by mouth daily.   rivaroxaban (XARELTO) 20 MG TABS tablet Take 20 mg by mouth daily with supper.   vitamin B-12 (CYANOCOBALAMIN) 1000 MCG tablet Take 1,000 mcg by mouth every evening.   [DISCONTINUED] pravastatin (PRAVACHOL) 40 MG tablet Take 40 mg by mouth every evening.   [DISCONTINUED] acetaminophen (TYLENOL) 325 MG tablet Take 2 tablets (650 mg total) by mouth every 6 (six) hours as needed for mild pain (or Fever >/= 101).   [DISCONTINUED] methocarbamol (ROBAXIN) 500 MG tablet Take 1 tablet (500 mg total) by mouth every 6 (six) hours as needed for muscle spasms. (Patient not taking: Reported on 05/20/2023)   No facility-administered medications prior to visit.    Allergies  Allergen Reactions  Pedi-Pre Tape Spray [Wound Dressing Adhesive] Rash    Patient Care Team: Windell Moment, MD as PCP - General (Family Medicine)  Review of Systems  HENT: Negative.    Gastrointestinal:  Positive for constipation. Negative for abdominal pain and blood in stool.  Skin: Negative.   All other systems reviewed and are negative.       Objective  BP 138/80 (BP Location: Right Arm, Patient Position: Sitting, Cuff Size: Large)   Pulse 64   Temp 97.6 F (36.4 C) (Temporal)   Resp 16   Ht 6\' 3"  (1.905 m)   Wt (!) 328 lb 6.4 oz (149 kg)   SpO2 97%   BMI 41.05 kg/m  BP Readings from Last 3 Encounters:  06/14/23 138/80  05/20/23 (!) 148/82  01/05/23 (!) 161/94   Wt Readings from Last 3 Encounters:  06/14/23 (!) 328 lb 6.4 oz (149 kg)  05/20/23 (!) 328 lb 9.6 oz (149.1 kg)  01/05/23 (!) 333 lb 8 oz (151.3 kg)      Physical Exam Vitals and nursing note reviewed.  Constitutional:      Appearance: He is obese.  HENT:     Head: Atraumatic.  Eyes:     Pupils: Pupils are equal, round, and reactive to light.  Cardiovascular:     Rate and Rhythm: Normal rate. Rhythm  irregular.  Pulmonary:     Effort: Pulmonary effort is normal.     Breath sounds: Normal breath sounds.  Musculoskeletal:        General: Swelling (pedal edema worse on left than right) present.     Cervical back: Normal range of motion.  Neurological:     General: No focal deficit present.     Mental Status: He is alert.  Psychiatric:        Mood and Affect: Mood normal.       Most recent functional status assessment:    06/14/2023   11:35 AM  In your present state of health, do you have any difficulty performing the following activities:  Hearing? 0  Vision? 0  Difficulty concentrating or making decisions? 0  Walking or climbing stairs? 0  Dressing or bathing? 0  Doing errands, shopping? 0  Preparing Food and eating ? N  Using the Toilet? N  In the past six months, have you accidently leaked urine? N  Do you have problems with loss of bowel control? N  Managing your Medications? N  Managing your Finances? N  Housekeeping or managing your Housekeeping? N   Most recent fall risk assessment:    06/14/2023   11:38 AM  Fall Risk   Falls in the past year? 0  Number falls in past yr: 0  Injury with Fall? 0  Risk for fall due to : No Fall Risks  Follow up Falls evaluation completed    Most recent depression screenings:    06/14/2023   11:37 AM 05/20/2023    1:29 PM  PHQ 2/9 Scores  PHQ - 2 Score 0 0  PHQ- 9 Score 0 0   Most recent cognitive screening:    06/14/2023   11:36 AM  6CIT Screen  What Year? 0 points  What month? 0 points  What time? 0 points  Count back from 20 0 points  Months in reverse 0 points  Repeat phrase 0 points  Total Score 0 points   Most recent Audit-C alcohol use screening    05/20/2023    1:34 PM  Alcohol Use Disorder Test (  AUDIT)  1. How often do you have a drink containing alcohol? 1  2. How many drinks containing alcohol do you have on a typical day when you are drinking? 0  3. How often do you have six or more drinks on  one occasion? 0  AUDIT-C Score 1   A score of 3 or more in women, and 4 or more in men indicates increased risk for alcohol abuse, EXCEPT if all of the points are from question 1   Vision/Hearing Screen: No results found.  Last CBC Lab Results  Component Value Date   WBC 6.6 01/04/2023   HGB 15.4 01/04/2023   HCT 45 01/04/2023   MCV 96.6 10/01/2021   MCH 32.2 10/01/2021   RDW 13.4 10/01/2021   PLT 192 01/04/2023   Last metabolic panel Lab Results  Component Value Date   GLUCOSE 112 (H) 09/08/2013   NA 139 01/04/2023   K 4.6 01/04/2023   CL 104 01/04/2023   CO2 26 (A) 01/04/2023   BUN 21 01/04/2023   CREATININE 1.0 01/04/2023   EGFR 60.0 01/04/2023   CALCIUM 9.4 01/04/2023   PROT 6.3 09/04/2013   ALBUMIN 4.0 01/04/2023   BILITOT 0.7 09/04/2013   ALKPHOS 73 01/04/2023   AST 35 01/04/2023   ALT 22 01/04/2023   Last lipids No results found for: "CHOL", "HDL", "LDLCALC", "LDLDIRECT", "TRIG", "CHOLHDL" Last hemoglobin A1c No results found for: "HGBA1C" Last thyroid functions Lab Results  Component Value Date   TSH 6.076 (H) 09/04/2013   Last vitamin D Lab Results  Component Value Date   VD25OH 44 09/04/2013   Last vitamin B12 and Folate No results found for: "VITAMINB12", "FOLATE"    No results found for any visits on 06/14/23.    Assessment & Plan   Annual wellness visit done today including the all of the following: Reviewed patient's Family Medical History Reviewed and updated list of patient's medical providers Assessment of cognitive impairment was done Assessed patient's functional ability Established a written schedule for health screening services Health Risk Assessent Completed and Reviewed  Exercise Activities and Dietary recommendations  Goals   None     Immunization History  Administered Date(s) Administered   Influenza, High Dose Seasonal PF 04/07/2023   Moderna Sars-Covid-2 Vaccination 08/07/2019, 09/03/2019   Pneumococcal  Conjugate-13 04/25/2014   Pneumococcal Polysaccharide-23 07/16/2015   Tdap 02/01/2017   Zoster Recombinant(Shingrix) 08/30/2021, 11/07/2021   Zoster, Live 08/15/2012    Health Maintenance  Topic Date Due   Hepatitis C Screening  Never done   COVID-19 Vaccine (3 - Moderna risk series) 10/01/2019   Medicare Annual Wellness (AWV)  06/13/2024   Colonoscopy  10/24/2025   DTaP/Tdap/Td (2 - Td or Tdap) 02/02/2027   Pneumonia Vaccine 35+ Years old  Completed   INFLUENZA VACCINE  Completed   Zoster Vaccines- Shingrix  Completed   HPV VACCINES  Aged Out     Discussed health benefits of physical activity, and encouraged him to engage in regular exercise appropriate for his age and condition.    Problem List Items Addressed This Visit     HYPERCHOLESTEROLEMIA   Hyperlipidemia managed with pravastatin 40 mg daily. Cholesterol levels are borderline but controlled with current medication. Discussed the benefits of pravastatin in reducing cardiovascular risk and inflammation.       Relevant Medications   pravastatin (PRAVACHOL) 40 MG tablet   Other Relevant Orders   Lipid panel   Essential hypertension   Blood pressure readings over the past month  are generally well-controlled with occasional readings in the low 140s. On hydralazine 25 mg three times daily and lisinopril 10 mg daily. - Continue current antihypertensive medications - Monitor blood pressure regularly      Relevant Medications   pravastatin (PRAVACHOL) 40 MG tablet   Other Relevant Orders   Comprehensive metabolic panel   ATRIAL FIBRILLATION   On Xarelto 20 mg daily. No current issues reported. Discussed the importance of continuing anticoagulation therapy to prevent stroke and other complications. - Continue Xarelto 20 mg daily - Schedule annual cardiology follow-up       Relevant Medications   pravastatin (PRAVACHOL) 40 MG tablet   Renal oncocytoma of left kidney   Last oncologist visit was in July, with a  follow-up scheduled for next July. - Continue annual follow-up with oncologist      Relevant Orders   CBC   Encounter for Medicare annual wellness exam - Primary   Up to date with vaccinations including flu, pneumonia, shingles, and COVID-19. No issues with vision, hearing, or activities of daily living. No falls or cognitive issues. Quit smoking in 1980 and drinks alcohol occasionally. Discussed the importance of regular exercise and gradual weight loss. - Encourage regular exercise - Monitor weight and aim for gradual weight loss - Order metabolic profile - Schedule follow-up in six months  Follow-up - Schedule follow-up appointment in six months - Order blood work including metabolic profile and lipid panel.      Functional constipation   Reports a change in bowel habits over the past 3-4 months, from daily bowel movements to every 2-3 days. Recent colonoscopy in March was normal, ruling out mechanical obstruction. No blood in stools, diarrhea, black stools, or abdominal pain reported. Possible contributing factors include insufficient water intake, low dietary fiber, and lack of exercise. Discussed that normal bowel habits can vary widely among individuals, and deviations from personal norms are concerning. - Increase water intake to 80-90 ounces per day - Increase dietary fiber intake - Engage in regular exercise - Continue using laxatives or stool softeners as needed - Reassess if symptoms persist       Return in 6 months (on 12/13/2023) for chronic disease follow up.     Windell Moment, MD

## 2023-06-14 NOTE — Assessment & Plan Note (Signed)
Last oncologist visit was in July, with a follow-up scheduled for next July. - Continue annual follow-up with oncologist

## 2023-06-15 ENCOUNTER — Telehealth: Payer: Self-pay

## 2023-06-15 LAB — COMPREHENSIVE METABOLIC PANEL
ALT: 15 [IU]/L (ref 0–44)
AST: 17 [IU]/L (ref 0–40)
Albumin: 4.1 g/dL (ref 3.8–4.8)
Alkaline Phosphatase: 87 [IU]/L (ref 44–121)
BUN/Creatinine Ratio: 18 (ref 10–24)
BUN: 19 mg/dL (ref 8–27)
Bilirubin Total: 0.7 mg/dL (ref 0.0–1.2)
CO2: 24 mmol/L (ref 20–29)
Calcium: 9.6 mg/dL (ref 8.6–10.2)
Chloride: 104 mmol/L (ref 96–106)
Creatinine, Ser: 1.08 mg/dL (ref 0.76–1.27)
Globulin, Total: 2.7 g/dL (ref 1.5–4.5)
Glucose: 74 mg/dL (ref 70–99)
Potassium: 5.1 mmol/L (ref 3.5–5.2)
Sodium: 144 mmol/L (ref 134–144)
Total Protein: 6.8 g/dL (ref 6.0–8.5)
eGFR: 73 mL/min/{1.73_m2} (ref >59)

## 2023-06-15 LAB — CBC
Hematocrit: 48.9 % (ref 37.5–51.0)
Hemoglobin: 16 g/dL (ref 13.0–17.7)
MCH: 31.7 pg (ref 26.6–33.0)
MCHC: 32.7 g/dL (ref 31.5–35.7)
MCV: 97 fL (ref 79–97)
Platelets: 197 10*3/uL (ref 150–450)
RBC: 5.04 x10E6/uL (ref 4.14–5.80)
RDW: 12.9 % (ref 11.6–15.4)
WBC: 6.4 10*3/uL (ref 3.4–10.8)

## 2023-06-15 LAB — LIPID PANEL
Chol/HDL Ratio: 3.1 {ratio} (ref 0.0–5.0)
Cholesterol, Total: 182 mg/dL (ref 100–199)
HDL: 59 mg/dL (ref >39)
LDL Chol Calc (NIH): 105 mg/dL — ABNORMAL HIGH (ref 0–99)
Triglycerides: 97 mg/dL (ref 0–149)
VLDL Cholesterol Cal: 18 mg/dL (ref 5–40)

## 2023-06-15 NOTE — Telephone Encounter (Signed)
Copied from CRM [id number]. Topic: Clinical - Lab/Test Results >> Jun 15, 2023 12:11 PM Danika B wrote: Reason for CRM: Patient called Marylene Land back. Was able to relay lab results with patient successfully.

## 2023-06-17 DIAGNOSIS — H34832 Tributary (branch) retinal vein occlusion, left eye, with macular edema: Secondary | ICD-10-CM | POA: Diagnosis not present

## 2023-06-17 DIAGNOSIS — D3132 Benign neoplasm of left choroid: Secondary | ICD-10-CM | POA: Diagnosis not present

## 2023-06-17 DIAGNOSIS — H35711 Central serous chorioretinopathy, right eye: Secondary | ICD-10-CM | POA: Diagnosis not present

## 2023-06-17 DIAGNOSIS — H43813 Vitreous degeneration, bilateral: Secondary | ICD-10-CM | POA: Diagnosis not present

## 2023-06-17 DIAGNOSIS — H25043 Posterior subcapsular polar age-related cataract, bilateral: Secondary | ICD-10-CM | POA: Diagnosis not present

## 2023-06-17 DIAGNOSIS — H35361 Drusen (degenerative) of macula, right eye: Secondary | ICD-10-CM | POA: Diagnosis not present

## 2023-06-17 DIAGNOSIS — D3131 Benign neoplasm of right choroid: Secondary | ICD-10-CM | POA: Diagnosis not present

## 2023-06-17 DIAGNOSIS — H2513 Age-related nuclear cataract, bilateral: Secondary | ICD-10-CM | POA: Diagnosis not present

## 2023-07-01 ENCOUNTER — Other Ambulatory Visit: Payer: Self-pay

## 2023-07-01 MED ORDER — RIVAROXABAN 20 MG PO TABS: 20.0000 mg | ORAL_TABLET | Freq: Every day | ORAL | 30 refills | Status: DC

## 2023-07-01 NOTE — Telephone Encounter (Signed)
 Copied from CRM [id number]. Topic: Clinical - Medication Refill >> Jul 01, 2023  2:34 PM Chase BROCKS wrote: Most Recent Primary Care Visit:  Provider: IVIN SNUFFER  Department: [facility]  Visit Type: ANNUAL WELL VISIT, SEQUENTIAL  Date: 06/14/2023  Medication: rivaroxaban  (XARELTO ) 20 MG TABS tablet  Has the patient contacted their pharmacy? Yes (Agent: If no, request that the patient contact the pharmacy for the refill. If patient does not wish to contact the pharmacy document the reason why and proceed with request.) (Agent: If yes, when and what did the pharmacy advise?)  Is this the correct pharmacy for this prescription? Yes If no, delete pharmacy and type the correct one.  This is the patient's preferred pharmacy:  [id number] - [place], [place] - [address] [place] [address] Phone: [phone] Fax: [phone]   Has the prescription been filled recently? Yes  Is the patient out of the medication? Yes  Has the patient been seen for an appointment in the last year OR does the patient have an upcoming appointment? Yes  Can we respond through MyChart? Yes  Agent: Please be advised that Rx refills may take up to 3 business days. We ask that you follow-up with your pharmacy.

## 2023-07-22 DIAGNOSIS — D225 Melanocytic nevi of trunk: Secondary | ICD-10-CM | POA: Diagnosis not present

## 2023-07-22 DIAGNOSIS — D2372 Other benign neoplasm of skin of left lower limb, including hip: Secondary | ICD-10-CM | POA: Diagnosis not present

## 2023-07-22 DIAGNOSIS — D2262 Melanocytic nevi of left upper limb, including shoulder: Secondary | ICD-10-CM | POA: Diagnosis not present

## 2023-07-22 DIAGNOSIS — L821 Other seborrheic keratosis: Secondary | ICD-10-CM | POA: Diagnosis not present

## 2023-07-22 DIAGNOSIS — L570 Actinic keratosis: Secondary | ICD-10-CM | POA: Diagnosis not present

## 2023-07-22 DIAGNOSIS — D2261 Melanocytic nevi of right upper limb, including shoulder: Secondary | ICD-10-CM | POA: Diagnosis not present

## 2023-07-22 DIAGNOSIS — Z86018 Personal history of other benign neoplasm: Secondary | ICD-10-CM | POA: Diagnosis not present

## 2023-07-22 DIAGNOSIS — L578 Other skin changes due to chronic exposure to nonionizing radiation: Secondary | ICD-10-CM | POA: Diagnosis not present

## 2023-08-07 ENCOUNTER — Other Ambulatory Visit: Payer: Self-pay

## 2023-08-09 ENCOUNTER — Ambulatory Visit (INDEPENDENT_AMBULATORY_CARE_PROVIDER_SITE_OTHER)
Admission: RE | Admit: 2023-08-09 | Discharge: 2023-08-09 | Disposition: A | Discharge status: Home / Self Care | Payer: Medicare HMO | Source: Ambulatory Visit

## 2023-08-09 ENCOUNTER — Ambulatory Visit (INDEPENDENT_AMBULATORY_CARE_PROVIDER_SITE_OTHER): Payer: Medicare HMO

## 2023-08-09 ENCOUNTER — Other Ambulatory Visit: Payer: Self-pay

## 2023-08-09 VITALS — BP 128/88 | HR 96 | Temp 97.6°F | Ht 75.0 in | Wt 323.0 lb

## 2023-08-09 DIAGNOSIS — R051 Acute cough: Secondary | ICD-10-CM | POA: Insufficient documentation

## 2023-08-09 DIAGNOSIS — R0989 Other specified symptoms and signs involving the circulatory and respiratory systems: Secondary | ICD-10-CM | POA: Diagnosis not present

## 2023-08-09 DIAGNOSIS — J069 Acute upper respiratory infection, unspecified: Secondary | ICD-10-CM | POA: Insufficient documentation

## 2023-08-09 DIAGNOSIS — C73 Malignant neoplasm of thyroid gland: Secondary | ICD-10-CM | POA: Diagnosis not present

## 2023-08-09 DIAGNOSIS — R059 Cough, unspecified: Secondary | ICD-10-CM | POA: Diagnosis not present

## 2023-08-09 DIAGNOSIS — I517 Cardiomegaly: Secondary | ICD-10-CM | POA: Diagnosis not present

## 2023-08-09 MED ORDER — PREDNISONE 20 MG PO TABS: 20.0000 mg | ORAL_TABLET | Freq: Two times a day (BID) | ORAL | 0 refills | Status: DC

## 2023-08-09 MED ORDER — HYDROCODONE BIT-HOMATROP MBR 5-1.5 MG/5ML PO SOLN: 5.0000 mL | Freq: Every evening | ORAL | 0 refills | Status: AC | PRN

## 2023-08-09 NOTE — Patient Instructions (Signed)
 VISIT SUMMARY:  Today, you were seen for congestion and a cough that you have been experiencing for the past week. Your symptoms started in your sinuses and have since moved to your chest. You have been using over-the-counter medications to manage your symptoms, but they have not completely resolved. You also mentioned some ear discomfort and a history of irregular heartbeat and blood clots.  YOUR PLAN:  -ACUTE BRONCHITIS: Acute bronchitis is an inflammation of the bronchial tubes in the lungs, usually caused by a viral infection. We will order a chest x-ray to rule out pneumonia. You are prescribed hydrocodone  with homatropine cough syrup to take 5 ml at bedtime as needed for your cough. Additionally, we recommend steam inhalation, using nasal saline, and elevating your head with an extra pillow to help relieve your symptoms.  -EARWAX IMPACTION: Earwax impaction occurs when earwax builds up in the ear canal, causing discomfort or hearing issues. We recommend using Dbrox ear drops to help soften the earwax. If your symptoms persist, you may need ear irrigation.  -ATRIAL FIBRILLATION: Atrial fibrillation is an irregular and often rapid heart rate that can increase the risk of strokes and other heart-related complications. Continue taking your Vyzulta as prescribed.  -HYPERTENSION: Hypertension, or high blood pressure, is a chronic condition that can lead to serious health issues if not managed properly. Your blood pressure was normal during the visit. Continue taking your Chloracetin as prescribed.  -DEEP VEIN THROMBOSIS: Deep vein thrombosis is a condition where blood clots form in deep veins, usually in the legs. Continue taking your Xarelto  as prescribed to manage this condition.  INSTRUCTIONS:  Please follow up with us  once your chest x-ray results are available. We will contact you with the results and discuss any further treatment plans based on the findings.

## 2023-08-09 NOTE — Progress Notes (Signed)
 Acute Office Visit  Subjective:    Patient ID: James Schaefer, male    DOB: 1950-09-16, 73 y.o.   MRN: [id number]  Chief Complaint  Patient presents with   Cough   Nasal Congestion    Discussed the use of AI scribe software for clinical note transcription with the patient, who gave verbal consent to proceed.      HPI: Patient is in today for cough, mucus, and congestion. Patient states it all started a couple weeks ago. He states he started taking OTC congestion medication and it seems to help. Patient states he has not had fever, chills. James Schaefer is a 73 year old male who presents with congestion and cough.  He has been experiencing congestion for the past week, starting on a Sunday, characterized by thick mucus in his head and ears. This is accompanied by a mostly dry cough that began on Saturday, with occasional coughing spells, including one just before the visit. No aches, pains, or fever are present, and his blood pressure is normal.  The symptoms initially began in his sinuses and have since moved to his chest. No nausea, vomiting, diarrhea, or significant appetite changes, although there is a slight decrease in appetite. He has experienced some shortness of breath and wheezing, which have improved slightly. No history of asthma, COPD, bronchitis, or other respiratory conditions is noted. He reports one headache and occasional ear pain, with the right ear feeling more stopped up than the left.  He has been using over-the-counter medications including Expatriant (mucus relief), Chloracetin (cold and flu), and decongestants, alternating them to manage symptoms. He previously used hydrocodone  cough syrup but is currently out of it. These medications help but do not completely resolve his symptoms. His mucus is usually clear, though it has become slightly discolored recently.  He has a history of walking pneumonia approximately 25 years ago but denies any recent  pneumonia or other significant respiratory infections. He is not a smoker and has not smoked for 40 years. He has a history of irregular heartbeat and takes Xarelto  for blood clots due to a past accident. He also mentions using a neti pot for sinus relief, which has been somewhat helpful.  Past Medical History:  Diagnosis Date   Hypercholesteremia    Hypertension     Past Surgical History:  Procedure Laterality Date   HERNIA REPAIR     INTRAMEDULLARY (IM) NAIL INTERTROCHANTERIC Right 09/04/2013   Procedure: INTRAMEDULLARY (IM) NAIL INTERTROCHANTRIC;  Surgeon: Forbes Ida., MD;  Location: [facility] OR;  Service: Orthopedics;  Laterality: Right;   knee replacements     penile pump      Family History  Problem Relation Age of Onset   Dementia Mother    CAD Father    Kidney failure Father    Asthma Sister    Pancreatic cancer Maternal Uncle     Social History   Socioeconomic History   Marital status: Married    Spouse name: James Schaefer   Number of children: 2   Years of education: 12 + SOME COLLEGE   Highest education level: Not on file  Occupational History   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  Tobacco Use   Smoking status: Former    Current packs/day: 0.00    Types: Cigarettes    Quit date: 09/04/1978    Years since quitting: 44.9   Smokeless tobacco: Former    Quit date: 09/04/1978  Vaping Use   Vaping status: Never Used  Substance  and Sexual Activity   Alcohol use: Yes    Comment: occassional   Drug use: No   Sexual activity: Not Currently  Other Topics Concern   Not on file  Social History Narrative   Not on file   Social Drivers of Health   Financial Resource Strain: Low Risk  (05/20/2023)   Overall Financial Resource Strain (CARDIA)    Difficulty of Paying Living Expenses: Not hard at all  Food Insecurity: No Food Insecurity (05/20/2023)   Hunger Vital Sign    Worried About Running Out of Food in the Last Year: Never true    Ran Out of Food in the Last Year: Never  true  Transportation Needs: No Transportation Needs (05/20/2023)   PRAPARE - Administrator, Civil Service (Medical): No    Lack of Transportation (Non-Medical): No  Physical Activity: Insufficiently Active (05/20/2023)   Exercise Vital Sign    Days of Exercise per Week: 1 day    Minutes of Exercise per Session: 30 min  Stress: No Stress Concern Present (05/20/2023)   [employer] of Occupational Health - Occupational Stress Questionnaire    Feeling of Stress : Not at all  Social Connections: Socially Integrated (05/20/2023)   Social Connection and Isolation Panel [NHANES]    Frequency of Communication with Friends and Family: More than three times a week    Frequency of Social Gatherings with Friends and Family: More than three times a week    Attends Religious Services: 1 to 4 times per year    Active Member of [employer] or Organizations: Yes    Attends Engineer, structural: More than 4 times per year    Marital Status: Married  Catering manager Violence: Not At Risk (05/20/2023)   Humiliation, Afraid, Rape, and Kick questionnaire    Fear of Current or Ex-Partner: No    Emotionally Abused: No    Physically Abused: No    Sexually Abused: No    Outpatient Medications Prior to Visit  Medication Sig Dispense Refill   ascorbic acid (VITAMIN C) 500 MG tablet Take 500 mg by mouth daily.     diltiazem (TIADYLT ER) 180 MG 24 hr capsule TAKE 1 CAPSULE BY MOUTH EVERY DAY FOR 90 DAYS 90 capsule 0   hydrALAZINE  (APRESOLINE ) 25 MG tablet Take 25 mg by mouth 3 (three) times daily.     lisinopril  (ZESTRIL ) 10 MG tablet Take 4 tablets (40 mg total) by mouth every evening. 90 tablet 1   Multiple Vitamin (MULTIVITAMIN) capsule Take 1 capsule by mouth daily.     pravastatin  (PRAVACHOL ) 40 MG tablet Take 1 tablet (40 mg total) by mouth every evening. 90 tablet 1   rivaroxaban  (XARELTO ) 20 MG TABS tablet Take 1 tablet (20 mg total) by mouth daily with supper. 30 tablet 30    vitamin B-12 (CYANOCOBALAMIN ) 1000 MCG tablet Take 1,000 mcg by mouth every evening.     No facility-administered medications prior to visit.    Allergies  Allergen Reactions   Codeine Other (See Comments) and Nausea And Vomiting   Pedi-Pre Tape Spray [Wound Dressing Adhesive] Rash   Tape Rash    Review of Systems  Constitutional:  Negative for chills, fatigue and fever.  HENT:  Positive for congestion. Negative for ear pain and sinus pain.   Respiratory:  Positive for cough. Negative for shortness of breath.   Cardiovascular:  Negative for chest pain.  Gastrointestinal:  Negative for abdominal pain, constipation, diarrhea, nausea and vomiting.  Musculoskeletal:  Negative for myalgias.  Neurological:  Negative for headaches.       Objective:        08/09/2023    8:27 AM 06/14/2023   11:26 AM 05/20/2023    2:17 PM  Vitals with BMI  Height 6\' 3"  6\' 3"    Weight 323 lbs 328 lbs 6 oz   BMI 40.37 41.05   Systolic 128 138 147  Diastolic 88 80 82  Pulse 96 64     No data found.   Physical Exam Vitals and nursing note reviewed.  Constitutional:      Appearance: He is obese.  HENT:     Head:     Comments: HEENT: Significant cerumen in external auditory canals; right ear more obstructed than left. CHEST: Wheezing upon auscultation, left side worse than right. Neurological:     Mental Status: He is alert.     Health Maintenance Due  Topic Date Due   Hepatitis C Screening  Never done   COVID-19 Vaccine (3 - Moderna risk series) 10/01/2019    There are no preventive care reminders to display for this patient.   Lab Results  Component Value Date   TSH 6.076 (H) 09/04/2013   Lab Results  Component Value Date   WBC 6.4 06/14/2023   HGB 16.0 06/14/2023   HCT 48.9 06/14/2023   MCV 97 06/14/2023   PLT 197 06/14/2023   Lab Results  Component Value Date   NA 144 06/14/2023   K 5.1 06/14/2023   CO2 24 06/14/2023   GLUCOSE 74 06/14/2023   BUN 19 06/14/2023    CREATININE 1.08 06/14/2023   BILITOT 0.7 06/14/2023   ALKPHOS 87 06/14/2023   AST 17 06/14/2023   ALT 15 06/14/2023   PROT 6.8 06/14/2023   ALBUMIN 4.1 06/14/2023   CALCIUM 9.6 06/14/2023   EGFR 73 06/14/2023   Lab Results  Component Value Date   CHOL 182 06/14/2023   Lab Results  Component Value Date   HDL 59 06/14/2023   Lab Results  Component Value Date   LDLCALC 105 (H) 06/14/2023   Lab Results  Component Value Date   TRIG 97 06/14/2023   Lab Results  Component Value Date   CHOLHDL 3.1 06/14/2023   No results found for: "HGBA1C"     Assessment & Plan:  Acute cough Assessment & Plan: Acute onset of cough and congestion for 2 weeks, worsening over the past few days. No fever or significant pain. Dry cough, occasionally productive with clear mucus. Wheezing and rhonchi noted on the left side. No history of asthma, COPD, or recent smoking. Differential includes pneumonia. - Order chest x-ray to rule out pneumonia - Prescribe hydrocodone  with homatropine cough syrup, 5 ml at bedtime as needed for cough, with discussion of risks and side effects - Recommend steam inhalation - Advise nasal saline use - Elevate head with an extra pillow  Earwax Impaction Significant earwax buildup in both ears, right ear more affected. No significant pain or drainage. History of frequent ear infections. - Recommend Dbrox ear drops - Advise ear irrigation if symptoms persist  Orders: -     DG Chest 2 View; Future  URI with cough and congestion  Other orders -     HYDROcodone  Bit-Homatrop MBr; Take 5 mLs by mouth at bedtime as needed for up to 7 days for cough.  Dispense: 35 mL; Refill: 0     Meds ordered this encounter  Medications   HYDROcodone  bit-homatropine (HYCODAN)  5-1.5 MG/5ML syrup    Sig: Take 5 mLs by mouth at bedtime as needed for up to 7 days for cough.    Dispense:  35 mL    Refill:  0    Orders Placed This Encounter  Procedures   DG Chest 2 View      Follow-up: Return if symptoms worsen or fail to improve.  An After Visit Summary was printed and given to the patient.  Kota Ciancio, MD [facility] [phone]

## 2023-08-09 NOTE — Assessment & Plan Note (Signed)
 Acute onset of cough and congestion for 2 weeks, worsening over the past few days. No fever or significant pain. Dry cough, occasionally productive with clear mucus. Wheezing and rhonchi noted on the left side. No history of asthma, COPD, or recent smoking. Differential includes pneumonia. - Order chest x-ray to rule out pneumonia - Prescribe hydrocodone  with homatropine cough syrup, 5 ml at bedtime as needed for cough, with discussion of risks and side effects - Recommend steam inhalation - Advise nasal saline use - Elevate head with an extra pillow  Earwax Impaction Significant earwax buildup in both ears, right ear more affected. No significant pain or drainage. History of frequent ear infections. - Recommend Dbrox ear drops - Advise ear irrigation if symptoms persist

## 2023-09-10 ENCOUNTER — Other Ambulatory Visit: Payer: Self-pay

## 2023-09-10 NOTE — Telephone Encounter (Signed)
 Copied from CRM [id number]. Topic: Clinical - Medication Refill >> Sep 10, 2023  2:36 PM Gery Pray wrote: Most Recent Primary Care Visit:  Provider: Windell Moment  Department: [facility]  Visit Type: ACUTE  Date: 08/09/2023  Medication: hydrALAZINE (APRESOLINE) 25 MG tablet, lisinopril (ZESTRIL) 10 MG tablet, pravastatin (PRAVACHOL) 40 MG tablet  Has the patient contacted their pharmacy? Yes (Agent: If no, request that the patient contact the pharmacy for the refill. If patient does not wish to contact the pharmacy document the reason why and proceed with request.) (Agent: If yes, when and what did the pharmacy advise?) Pharmacy stated that there has been no response from [facility]  Is this the correct pharmacy for this prescription? Yes If no, delete pharmacy and type the correct one.  This is the patient's preferred pharmacy:  [id number] - [place], [place] - [address] [place] [address] Phone: [phone] Fax: [phone]   Has the prescription been filled recently? No  Is the patient out of the medication? Yes  Has the patient been seen for an appointment in the last year OR does the patient have an upcoming appointment? No Enough to last until Monday.  Can we respond through MyChart? No Please contact patient regarding refills, [phone]  Agent: Please be advised that Rx refills may take up to 3 business days. We ask that you follow-up with your pharmacy.

## 2023-09-13 ENCOUNTER — Telehealth: Payer: Self-pay

## 2023-09-13 MED ORDER — PRAVASTATIN SODIUM 40 MG PO TABS
40.0000 mg | ORAL_TABLET | Freq: Every evening | ORAL | 1 refills | Status: DC
Start: 2023-09-13 — End: 2023-09-13

## 2023-09-13 MED ORDER — PRAVASTATIN SODIUM 40 MG PO TABS: 40.0000 mg | ORAL_TABLET | Freq: Every evening | ORAL | 1 refills | Status: AC

## 2023-09-13 MED ORDER — LISINOPRIL 10 MG PO TABS: 40.0000 mg | ORAL_TABLET | Freq: Every evening | ORAL | 1 refills | Status: DC

## 2023-09-13 MED ORDER — HYDRALAZINE HCL 25 MG PO TABS: 25.0000 mg | ORAL_TABLET | Freq: Three times a day (TID) | ORAL | 1 refills | Status: DC

## 2023-09-13 NOTE — Telephone Encounter (Signed)
 Refill sent to pharmacy.   Copied from CRM [id number]. Topic: Clinical - Medication Refill >> Sep 10, 2023  2:36 PM Gery Pray wrote: Most Recent Primary Care Visit:  Provider: Windell Moment  Department: [facility]  Visit Type: ACUTE  Date: 08/09/2023  Medication: hydrALAZINE (APRESOLINE) 25 MG tablet, lisinopril (ZESTRIL) 10 MG tablet, pravastatin (PRAVACHOL) 40 MG tablet  Has the patient contacted their pharmacy? Yes (Agent: If no, request that the patient contact the pharmacy for the refill. If patient does not wish to contact the pharmacy document the reason why and proceed with request.) (Agent: If yes, when and what did the pharmacy advise?) Pharmacy stated that there has been no response from [facility]  Is this the correct pharmacy for this prescription? Yes If no, delete pharmacy and type the correct one.  This is the patient's preferred pharmacy:  [id number] - [place], [place] - [address] [place] [address] Phone: [phone] Fax: [phone]   Has the prescription been filled recently? No  Is the patient out of the medication? Yes  Has the patient been seen for an appointment in the last year OR does the patient have an upcoming appointment? No Enough to last until Monday.  Can we respond through MyChart? No Please contact patient regarding refills, [phone]  Agent: Please be advised that Rx refills may take up to 3 business days. We ask that you follow-up with your pharmacy.

## 2023-10-19 DIAGNOSIS — H43813 Vitreous degeneration, bilateral: Secondary | ICD-10-CM | POA: Diagnosis not present

## 2023-10-19 DIAGNOSIS — H34832 Tributary (branch) retinal vein occlusion, left eye, with macular edema: Secondary | ICD-10-CM | POA: Diagnosis not present

## 2023-10-19 DIAGNOSIS — D3132 Benign neoplasm of left choroid: Secondary | ICD-10-CM | POA: Diagnosis not present

## 2023-10-19 DIAGNOSIS — H35711 Central serous chorioretinopathy, right eye: Secondary | ICD-10-CM | POA: Diagnosis not present

## 2023-10-19 DIAGNOSIS — D3131 Benign neoplasm of right choroid: Secondary | ICD-10-CM | POA: Diagnosis not present

## 2023-10-25 ENCOUNTER — Ambulatory Visit: Payer: Self-pay

## 2023-10-25 NOTE — Telephone Encounter (Signed)
 Copied from CRM [id number]. Topic: Clinical - Prescription Issue >> Oct 25, 2023  2:48 PM Santiya F wrote: Reason for CRM: Patient's wife is calling in because patient had been taking lisinopril  (ZESTRIL ) 10 MG tablet [[id number]] 4x a day and wanted to know if he could get a prescription for 40 MG tabs because that is easier for patient to take. Reason for Disposition  [1] Follow-up call from patient regarding patient's clinical status AND [2] information NON-URGENT  Answer Assessment - Initial Assessment Questions 1. REASON FOR CALL or QUESTION: "What is your reason for calling today?" or "How can I best help you?" or "What question do you have that I can help answer?"     Patient's wife is calling in because patient had been taking lisinopril  (ZESTRIL ) 10 MG tablet [[id number]] 4x a day and wanted to know if he could get a prescription for 40 MG tabs because that is easier for patient to take. 2. CALLER: Document the source of call. (e.g., laboratory, patient).     Patient's wife  Protocols used: PCP Call - No Triage-A-AH

## 2023-10-26 ENCOUNTER — Other Ambulatory Visit: Payer: Self-pay

## 2023-10-26 MED ORDER — LISINOPRIL 40 MG PO TABS: 40.0000 mg | ORAL_TABLET | Freq: Every day | ORAL | 0 refills | Status: DC

## 2023-11-02 MED ORDER — LINACLOTIDE 72 MCG PO CAPS: 72.0000 ug | ORAL_CAPSULE | Freq: Every day | ORAL | 2 refills | Status: DC

## 2023-11-02 NOTE — Addendum Note (Signed)
 Addended by: Andersen Iorio on: 11/02/2023 04:19 PM   Modules accepted: Orders

## 2023-11-02 NOTE — Telephone Encounter (Signed)
 Copied from CRM [id number]. Topic: Clinical - Medication Question >> Nov 02, 2023 12:40 PM Bearl Botts E wrote: Reason for CRM: Pt's wife wants to know if the patient's PCP would be willing to order Linzess 72 MCG Capsules taking 1 every morning 30 minutes before breakfast for the patient.   Best contact: [phone]

## 2023-11-05 ENCOUNTER — Other Ambulatory Visit: Payer: Self-pay

## 2023-12-13 ENCOUNTER — Ambulatory Visit (INDEPENDENT_AMBULATORY_CARE_PROVIDER_SITE_OTHER): Payer: Medicare HMO

## 2023-12-13 VITALS — BP 130/80 | HR 92 | Temp 97.6°F | Ht 75.0 in | Wt 323.6 lb

## 2023-12-13 DIAGNOSIS — R7989 Other specified abnormal findings of blood chemistry: Secondary | ICD-10-CM | POA: Diagnosis not present

## 2023-12-13 DIAGNOSIS — I4891 Unspecified atrial fibrillation: Secondary | ICD-10-CM

## 2023-12-13 DIAGNOSIS — D3002 Benign neoplasm of left kidney: Secondary | ICD-10-CM | POA: Diagnosis not present

## 2023-12-13 DIAGNOSIS — E7800 Pure hypercholesterolemia, unspecified: Secondary | ICD-10-CM | POA: Diagnosis not present

## 2023-12-13 DIAGNOSIS — I10 Essential (primary) hypertension: Secondary | ICD-10-CM | POA: Diagnosis not present

## 2023-12-13 DIAGNOSIS — Z6841 Body Mass Index (BMI) 40.0 and over, adult: Secondary | ICD-10-CM

## 2023-12-13 DIAGNOSIS — E66813 Obesity, class 3: Secondary | ICD-10-CM | POA: Diagnosis not present

## 2023-12-13 DIAGNOSIS — K5904 Chronic idiopathic constipation: Secondary | ICD-10-CM | POA: Diagnosis not present

## 2023-12-13 MED ORDER — LINACLOTIDE 145 MCG PO CAPS: 145.0000 ug | ORAL_CAPSULE | Freq: Every day | ORAL | 2 refills | Status: DC

## 2023-12-13 NOTE — Assessment & Plan Note (Signed)
 Just started to watch his portion sizes and has started to make healthy choices. Advised to also add regular exercise.  May consider a GLP1 agonist at future visits

## 2023-12-13 NOTE — Patient Instructions (Signed)
  VISIT SUMMARY: Today, we discussed your blood pressure management, atrial fibrillation, renal oncocytomas, and constipation. We made some adjustments to your medications and reviewed lifestyle changes to help manage your conditions.  YOUR PLAN: HYPERTENSION: Your blood pressure readings at home range from 130-140/78-86 mmHg, and today's reading was 140/88 mmHg. -Continue taking hydralazine  25 mg three times daily, lisinopril  40 mg daily, and diltiazem 180 mg daily. -Work on portion control, reducing sodium intake, and increasing physical activity to help manage your blood pressure. -Recheck your blood pressure before leaving the office. -We may adjust your medications if your home blood pressure readings remain high.  ATRIAL FIBRILLATION: You are on Xarelto  for atrial fibrillation and blood clots, which is effectively managing your condition. -Continue taking Xarelto  as prescribed.  RENAL ONCOCYTOMA: You have bilateral renal oncocytomas that are stable and monitored annually. 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 -Continue with annual imaging and follow-up with your oncologist. -Ensure your oncologist receives the imaging results.  CONSTIPATION: You experience constipation and find your current dose of Linzess  insufficient. -Increase Linzess  to 145 mcg daily before breakfast. -Increase your physical activity, fiber intake, and water  consumption to help with bowel movements.                      Contains text generated by [employer].                                 Contains text generated by [employer].

## 2023-12-13 NOTE — Assessment & Plan Note (Signed)
 renal oncocytomas with well-managed imaging findings. Biopsy proven, not considered malignant but monitored for potential growth. Follow-up with oncologist scheduled. 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 - Continue annual imaging and follow-up with oncologist - Ensure oncologist receives imaging results

## 2023-12-13 NOTE — Assessment & Plan Note (Signed)
 Current treatment with Linzess  72 mcg daily is insufficient. No signs of bowel obstruction as there is some bowel movement. Reports difficulty with bowel movements every three days and uses Elastin weekly. No abdominal pain reported. Decision made to increase Linzess  to 145 mcg daily to improve bowel movements, with the expectation of better outcomes with increased dosage. - Increase Linzess  to 145 mcg daily before breakfast - Encourage increased physical activity, fiber intake, and water  consumption

## 2023-12-13 NOTE — Assessment & Plan Note (Signed)
 Blood pressure readings at home range from 130-140/78-86 mmHg. In-office reading today is 140/88 mmHg. Currently on hydralazine  25 mg three times daily, lisinopril  40 mg daily, and diltiazem 180 mg daily. Lifestyle modifications including portion control, reducing sodium intake, and increasing physical activity were discussed to aid in blood pressure control. Emphasized the importance of these changes to potentially reduce the need for medication adjustments. REPEAT BP NORMAL.  - Continue hydralazine , lisinopril , and diltiazem - Encourage lifestyle modifications: portion control, reduce sodium intake, increase physical activity -- Adjust medications if home blood pressure readings remain high

## 2023-12-13 NOTE — Progress Notes (Signed)
 Subjective:  Patient ID: James Schaefer, male    DOB: 10-06-1950  Age: 73 y.o. MRN: [id number]  Chief Complaint  Patient presents with   Medical Management of Chronic Issues    HPI: Discussed the use of AI scribe software for clinical note transcription with the patient, who gave verbal consent to proceed.  History of Present Illness   James Schaefer is a 73 year old male with hypertension and atrial fibrillation who presents for follow-up of blood pressure management and constipation. He is accompanied by his wife.  His blood pressure is being monitored at home, typically ranging from 130-140/78-86 mmHg, with today's initial reading at 140/88 mmHg. He is currently on hydralazine  25 mg three times daily, lisinopril  40 mg daily, and diltiazem 180 mg daily. He is actively working on weight management by reducing portion sizes and has started a diet two weeks ago. He is also trying to increase his physical activity and plans to walk more regularly.  He experiences constipation and is taking Linzess  72 mcg daily, which he finds ineffective. He has tried a higher dose from his wife's prescription, which he found more effective. He also takes a stool softener once a week, which helps. He is concerned about a possible blockage but reports having bowel movements every three days, albeit with difficulty. No associated pain.  He has a history of atrial fibrillation and blood clotting issues following a work-related accident in 2015, which resulted in blood clots in his leg. He has been on Xarelto  since then and has a filter placed to prevent clots from reaching his heart. He reports irregular heartbeats but states that Xarelto  is managing his conditions well.  He is followed by an oncologist for renal oncocytoma of the left kidney, with imaging done once a year. The last imaging in July 2024 showed stable findings. He reports that his oncologist told him he has bilateral oncocytomas  and stable renal masses based on biopsy and imaging. 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  He has a history of thyroid  issues, having undergone radiation treatment in 2012 or 2013. He is concerned about his thyroid  function and is due for blood work to check his thyroid  levels, cholesterol, and kidney function.         12/13/2023   10:45 AM 08/09/2023    8:31 AM 06/14/2023   11:37 AM 05/20/2023    1:29 PM  Depression screen PHQ 2/9  Decreased Interest 0 0 0 0  Down, Depressed, Hopeless 0 0 0 0  PHQ - 2 Score 0 0 0 0  Altered sleeping  0 0 0  Tired, decreased energy  0 0 0  Change in appetite  0 0 0  Feeling bad or failure about yourself   0 0 0  Trouble concentrating  0 0 0  Moving slowly or fidgety/restless  0 0 0  Suicidal thoughts  0 0 0  PHQ-9 Score  0 0 0  Difficult doing work/chores  Not difficult at all Not difficult at all Not difficult at all        12/13/2023   10:45 AM  Fall Risk   Falls in the past year? 0  Number falls in past yr: 0  Injury with Fall? 0  Risk for fall due to : No Fall Risks    Patient Care Team: Ziyad Dyar, MD as PCP - General (Family Medicine)   Review of Systems  Constitutional:  Negative for chills, fatigue and  fever.  HENT:  Negative for congestion, ear pain, sinus pressure and sore throat.   Respiratory:  Negative for cough and shortness of breath.   Cardiovascular:  Negative for chest pain.  Gastrointestinal:  Positive for constipation. Negative for abdominal pain, diarrhea, nausea and vomiting.  Genitourinary:  Negative for dysuria and frequency.  Musculoskeletal:  Negative for arthralgias, back pain and myalgias.  Neurological:  Negative for dizziness and headaches.  Psychiatric/Behavioral:  Negative for dysphoric mood. The patient is not nervous/anxious.     Current Outpatient Medications on File Prior to Visit  Medication Sig Dispense Refill   ascorbic acid (VITAMIN C) 500 MG tablet Take 500 mg  by mouth daily.     hydrALAZINE  (APRESOLINE ) 25 MG tablet Take 1 tablet (25 mg total) by mouth 3 (three) times daily. 90 tablet 1   lisinopril  (ZESTRIL ) 40 MG tablet Take 1 tablet (40 mg total) by mouth daily. 90 tablet 0   Multiple Vitamin (MULTIVITAMIN) capsule Take 1 capsule by mouth daily.     pravastatin  (PRAVACHOL ) 40 MG tablet Take 1 tablet (40 mg total) by mouth every evening. 90 tablet 1   predniSONE  (DELTASONE ) 20 MG tablet Take 1 tablet (20 mg total) by mouth in the morning and at bedtime. 10 tablet 0   rivaroxaban  (XARELTO ) 20 MG TABS tablet Take 1 tablet (20 mg total) by mouth daily with supper. 30 tablet 30   TIADYLT ER 180 MG 24 hr capsule TAKE 1 CAPSULE BY MOUTH EVERY DAY 90 capsule 0   vitamin B-12 (CYANOCOBALAMIN ) 1000 MCG tablet Take 1,000 mcg by mouth every evening.     No current facility-administered medications on file prior to visit.   Past Medical History:  Diagnosis Date   Hypercholesteremia    Hypertension    Past Surgical History:  Procedure Laterality Date   HERNIA REPAIR     INTRAMEDULLARY (IM) NAIL INTERTROCHANTERIC Right 09/04/2013   Procedure: INTRAMEDULLARY (IM) NAIL INTERTROCHANTRIC;  Surgeon: Forbes Ida., MD;  Location: [facility] OR;  Service: Orthopedics;  Laterality: Right;   knee replacements     penile pump      Family History  Problem Relation Age of Onset   Dementia Mother    CAD Father    Kidney failure Father    Asthma Sister    Pancreatic cancer Maternal Uncle    Social History   Socioeconomic History   Marital status: Married    Spouse name: [name]   Number of children: 2   Years of education: 12 + SOME COLLEGE   Highest education level: Not on file  Occupational History   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  Tobacco Use   Smoking status: Former    Current packs/day: 0.00    Types: Cigarettes    Quit date: 09/04/1978    Years since quitting: 45.3   Smokeless tobacco: Former    Quit date: 09/04/1978  Vaping Use   Vaping status: Never  Used  Substance and Sexual Activity   Alcohol use: Yes    Comment: occassional   Drug use: No   Sexual activity: Not Currently  Other Topics Concern   Not on file  Social History Narrative   Not on file   Social Drivers of Health   Financial Resource Strain: Low Risk  (05/20/2023)   Overall Financial Resource Strain (CARDIA)    Difficulty of Paying Living Expenses: Not hard at all  Food Insecurity: No Food Insecurity (05/20/2023)   Hunger Vital Sign    Worried  About Running Out of Food in the Last Year: Never true    Ran Out of Food in the Last Year: Never true  Transportation Needs: No Transportation Needs (05/20/2023)   PRAPARE - Administrator, Civil Service (Medical): No    Lack of Transportation (Non-Medical): No  Physical Activity: Insufficiently Active (05/20/2023)   Exercise Vital Sign    Days of Exercise per Week: 1 day    Minutes of Exercise per Session: 30 min  Stress: No Stress Concern Present (05/20/2023)   [employer] of Occupational Health - Occupational Stress Questionnaire    Feeling of Stress : Not at all  Social Connections: Socially Integrated (05/20/2023)   Social Connection and Isolation Panel    Frequency of Communication with Friends and Family: More than three times a week    Frequency of Social Gatherings with Friends and Family: More than three times a week    Attends Religious Services: 1 to 4 times per year    Active Member of [employer] or Organizations: Yes    Attends Engineer, structural: More than 4 times per year    Marital Status: Married    Objective:  BP 130/80   Pulse 92   Temp 97.6 F (36.4 C)   Ht 6' 3 (1.905 m)   Wt (!) 323 lb 9.6 oz (146.8 kg)   SpO2 97%   BMI 40.45 kg/m      12/13/2023   11:14 AM 12/13/2023   10:36 AM 08/09/2023    8:27 AM  BP/Weight  Systolic BP 130 140 128  Diastolic BP 80 88 88  Wt. (Lbs)  323.6 323  BMI  40.45 kg/m2 40.37 kg/m2    Physical Exam Vitals and nursing note  reviewed.  HENT:     Head: Normocephalic and atraumatic.   Cardiovascular:     Rate and Rhythm: Normal rate and regular rhythm.  Pulmonary:     Effort: Pulmonary effort is normal.     Breath sounds: Normal breath sounds.   Musculoskeletal:        General: Swelling (trace bilateral swelling) present. Normal range of motion.   Neurological:     General: No focal deficit present.     Mental Status: He is alert.   Psychiatric:        Mood and Affect: Mood normal.         Lab Results  Component Value Date   WBC 6.4 06/14/2023   HGB 16.0 06/14/2023   HCT 48.9 06/14/2023   PLT 197 06/14/2023   GLUCOSE 74 06/14/2023   CHOL 182 06/14/2023   TRIG 97 06/14/2023   HDL 59 06/14/2023   LDLCALC 105 (H) 06/14/2023   ALT 15 06/14/2023   AST 17 06/14/2023   NA 144 06/14/2023   K 5.1 06/14/2023   CL 104 06/14/2023   CREATININE 1.08 06/14/2023   BUN 19 06/14/2023   CO2 24 06/14/2023   TSH 6.076 (H) 09/04/2013   INR 1.1 10/01/2021      Assessment & Plan:  Functional constipation Assessment & Plan: Current treatment with Linzess  72 mcg daily is insufficient. No signs of bowel obstruction as there is some bowel movement. Reports difficulty with bowel movements every three days and uses Elastin weekly. No abdominal pain reported. Decision made to increase Linzess  to 145 mcg daily to improve bowel movements, with the expectation of better outcomes with increased dosage. - Increase Linzess  to 145 mcg daily before breakfast - Encourage increased physical activity,  fiber intake, and water  consumption    Class 3 severe obesity due to excess calories with serious comorbidity and body mass index (BMI) of 40.0 to 44.9 in adult Assessment & Plan: Just started to watch his portion sizes and has started to make healthy choices. Advised to also add regular exercise.  May consider a GLP1 agonist at future visits   Essential hypertension Assessment & Plan: Blood pressure readings at  home range from 130-140/78-86 mmHg. In-office reading today is 140/88 mmHg. Currently on hydralazine  25 mg three times daily, lisinopril  40 mg daily, and diltiazem 180 mg daily. Lifestyle modifications including portion control, reducing sodium intake, and increasing physical activity were discussed to aid in blood pressure control. Emphasized the importance of these changes to potentially reduce the need for medication adjustments. REPEAT BP NORMAL.  - Continue hydralazine , lisinopril , and diltiazem - Encourage lifestyle modifications: portion control, reduce sodium intake, increase physical activity -- Adjust medications if home blood pressure readings remain high  Orders: -     CBC with Differential/Platelet -     Comprehensive metabolic panel with GFR -     Lipid panel  HYPERCHOLESTEROLEMIA -     CBC with Differential/Platelet -     Comprehensive metabolic panel with GFR -     Lipid panel  Atrial fibrillation, unspecified type [facility]) Assessment & Plan: On Xarelto  for atrial fibrillation and blood clots following a traumatic accident in 2015. Xarelto  is effectively managing both atrial fibrillation and preventing further clotting issues. - Continue Xarelto  as prescribed   Abnormal thyroid  blood test -     T4, free -     TSH  Renal oncocytoma of left kidney Assessment & Plan: renal oncocytomas with well-managed imaging findings. Biopsy proven, not considered malignant but monitored for potential growth. Follow-up with oncologist scheduled. 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 - Continue annual imaging and follow-up with oncologist - Ensure oncologist receives imaging results   Other orders -     linaCLOtide ; Take 1 capsule (145 mcg total) by mouth daily before breakfast.  Dispense: 30 capsule; Refill: 2   Assessment and Plan            Meds ordered this encounter   Medications   linaclotide  (LINZESS ) 145 MCG CAPS capsule    Sig: Take 1 capsule (145 mcg total) by mouth daily before breakfast.    Dispense:  30 capsule    Refill:  2    Orders Placed This Encounter  Procedures   CBC with Differential   Comprehensive metabolic panel with GFR   Lipid Panel   T4, free   TSH     Follow-up: Return for chronic disease follow up. An After Visit Summary was printed and given to the patient.  Malaika Arnall, MD [facility] [phone]

## 2023-12-13 NOTE — Assessment & Plan Note (Signed)
 On Xarelto  for atrial fibrillation and blood clots following a traumatic accident in 2015. Xarelto  is effectively managing both atrial fibrillation and preventing further clotting issues. - Continue Xarelto  as prescribed

## 2023-12-14 ENCOUNTER — Ambulatory Visit: Payer: Self-pay

## 2023-12-14 LAB — CBC WITH DIFFERENTIAL/PLATELET
Basophils Absolute: 0.1 10*3/uL (ref 0.0–0.2)
Basos: 1 %
EOS (ABSOLUTE): 0.3 10*3/uL (ref 0.0–0.4)
Eos: 3 %
Hematocrit: 48.2 % (ref 37.5–51.0)
Hemoglobin: 15.6 g/dL (ref 13.0–17.7)
Immature Grans (Abs): 0 10*3/uL (ref 0.0–0.1)
Immature Granulocytes: 0 %
Lymphocytes Absolute: 0.6 10*3/uL — ABNORMAL LOW (ref 0.7–3.1)
Lymphs: 7 %
MCH: 31.7 pg (ref 26.6–33.0)
MCHC: 32.4 g/dL (ref 31.5–35.7)
MCV: 98 fL — ABNORMAL HIGH (ref 79–97)
Monocytes Absolute: 0.6 10*3/uL (ref 0.1–0.9)
Monocytes: 7 %
Neutrophils Absolute: 6.4 10*3/uL (ref 1.4–7.0)
Neutrophils: 81 %
Platelets: 189 10*3/uL (ref 150–450)
RBC: 4.92 x10E6/uL (ref 4.14–5.80)
RDW: 12.9 % (ref 11.6–15.4)
WBC: 7.9 10*3/uL (ref 3.4–10.8)

## 2023-12-14 LAB — COMPREHENSIVE METABOLIC PANEL WITH GFR
ALT: 13 IU/L (ref 0–44)
AST: 17 IU/L (ref 0–40)
Albumin: 4 g/dL (ref 3.8–4.8)
Alkaline Phosphatase: 83 IU/L (ref 44–121)
BUN/Creatinine Ratio: 18 (ref 10–24)
BUN: 20 mg/dL (ref 8–27)
Bilirubin Total: 0.6 mg/dL (ref 0.0–1.2)
CO2: 23 mmol/L (ref 20–29)
Calcium: 9.4 mg/dL (ref 8.6–10.2)
Chloride: 105 mmol/L (ref 96–106)
Creatinine, Ser: 1.14 mg/dL (ref 0.76–1.27)
Globulin, Total: 2.6 g/dL (ref 1.5–4.5)
Glucose: 83 mg/dL (ref 70–99)
Potassium: 4.3 mmol/L (ref 3.5–5.2)
Sodium: 143 mmol/L (ref 134–144)
Total Protein: 6.6 g/dL (ref 6.0–8.5)
eGFR: 68 mL/min/{1.73_m2} (ref >59)

## 2023-12-14 LAB — LIPID PANEL
Chol/HDL Ratio: 3 ratio (ref 0.0–5.0)
Cholesterol, Total: 164 mg/dL (ref 100–199)
HDL: 55 mg/dL (ref >39)
LDL Chol Calc (NIH): 84 mg/dL (ref 0–99)
Triglycerides: 143 mg/dL (ref 0–149)
VLDL Cholesterol Cal: 25 mg/dL (ref 5–40)

## 2023-12-14 LAB — TSH: TSH: 3.47 u[IU]/mL (ref 0.450–4.500)

## 2023-12-14 LAB — T4, FREE: Free T4: 1.1 ng/dL (ref 0.82–1.77)

## 2023-12-16 ENCOUNTER — Ambulatory Visit (HOSPITAL_BASED_OUTPATIENT_CLINIC_OR_DEPARTMENT_OTHER)
Admission: RE | Admit: 2023-12-16 | Discharge: 2023-12-16 | Disposition: A | Discharge status: Home / Self Care | Source: Ambulatory Visit | Attending: Oncology | Admitting: Oncology

## 2023-12-16 DIAGNOSIS — R590 Localized enlarged lymph nodes: Secondary | ICD-10-CM | POA: Diagnosis not present

## 2023-12-16 DIAGNOSIS — N2889 Other specified disorders of kidney and ureter: Secondary | ICD-10-CM

## 2023-12-16 DIAGNOSIS — N200 Calculus of kidney: Secondary | ICD-10-CM | POA: Diagnosis not present

## 2023-12-16 DIAGNOSIS — R160 Hepatomegaly, not elsewhere classified: Secondary | ICD-10-CM | POA: Diagnosis not present

## 2023-12-16 MED ORDER — IOHEXOL 300 MG/ML  SOLN
100.0000 mL | Freq: Once | INTRAMUSCULAR | Status: AC | PRN
  Administered 2023-12-16: 100 mL via INTRAVENOUS

## 2024-01-02 ENCOUNTER — Other Ambulatory Visit: Payer: Self-pay

## 2024-01-04 ENCOUNTER — Telehealth: Payer: Self-pay

## 2024-01-04 NOTE — Progress Notes (Unsigned)
 [facility]  [address] [place],  [place]  [address] [phone]  Clinic Day:  01/05/2023  Referring physician: Dottie Norleen PHEBE PONCE, MD   HISTORY OF PRESENT ILLNESS:  The patient is a 73 y.o. male who I follow for biopsy-proven bilateral oncocytomas.  Scans have shown stable, bilateral renal masses.  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  He comes in today to go over his annual CT scans to ensure his bilateral renal masses have not changed in size.  Since his last visit, the patient has been doing well.  He denies having any hematuria, dysuria, or costovertebral angle tenderness which concerns him for possible disease progression.  PHYSICAL EXAM:  Blood pressure 118/83, pulse 79, temperature 98.2 F (36.8 C), temperature source Oral, resp. rate 20, height 6' 3 (1.905 m), weight (!) 316 lb 12.8 oz (143.7 kg), SpO2 95%. Wt Readings from Last 3 Encounters:  01/05/24 (!) 316 lb 12.8 oz (143.7 kg)  12/13/23 (!) 323 lb 9.6 oz (146.8 kg)  08/09/23 (!) 323 lb (146.5 kg)   Body mass index is 39.6 kg/m. Performance status (ECOG): 1 - Symptomatic but completely ambulatory Physical Exam Constitutional:      Appearance: Normal appearance. He is not ill-appearing.  HENT:     Mouth/Throat:     Mouth: Mucous membranes are moist.     Pharynx: Oropharynx is clear. No oropharyngeal exudate or posterior oropharyngeal erythema.  Cardiovascular:     Rate and Rhythm: Normal rate and regular rhythm.     Heart sounds: No murmur heard.    No friction rub. No gallop.  Pulmonary:     Effort: Pulmonary effort is normal. No respiratory distress.     Breath sounds: Normal breath sounds. No wheezing, rhonchi or rales.  Abdominal:     General: Bowel sounds are normal. There is no distension.     Palpations: Abdomen is soft. There is no mass.     Tenderness: There is no  abdominal tenderness.  Musculoskeletal:        General: No swelling.     Right lower leg: No edema.     Left lower leg: No edema.  Lymphadenopathy:     Cervical: No cervical adenopathy.     Upper Body:     Right upper body: No supraclavicular or axillary adenopathy.     Left upper body: No supraclavicular or axillary adenopathy.     Lower Body: No right inguinal adenopathy. No left inguinal adenopathy.  Skin:    General: Skin is warm.     Coloration: Skin is not jaundiced.     Findings: No lesion or rash.  Neurological:     General: No focal deficit present.     Mental Status: He is alert and oriented to person, place, and time. Mental status is at baseline.  Psychiatric:        Mood and Affect: Mood normal.        Behavior: Behavior normal.        Thought Content: Thought content normal.    SCANS: CT scans of his abdomen/pelvis on 12-27-23 revealed the following: FINDINGS: Lower chest: No acute abnormality.   Hepatobiliary: Hypervascular mass with central low attenuation is again seen in the anterior segment of the right hepatic lobe. This measures 3.6 cm in greatest dimension and is not substantially changed from 01/04/2023 using similar measuring technique. No other hepatic masses are identified. Gallbladder  and biliary tree are unremarkable.   Pancreas: Unremarkable.   Spleen: Unremarkable.   Adrenals/Urinary Tract: Unchanged left adrenal myelolipoma. No follow-up recommended. Normal right adrenal gland.   Heterogenously enhancing renal masses are again seen in both kidneys. In the right kidney this measures 6.4 x 4.7 cm and is not substantially changed from 01/04/2023 using similar measuring technique. In the left kidney the peripherally enhancing centrally hypoattenuating mass measures 5.7 x 5.0 cm, unchanged using similar measuring technique. Second mass in the left kidney in the inferior pole demonstrates heterogenous hypoattenuation and is unchanged measuring  6.4 x 6.7 cm.   Bilateral nonobstructing nephrolithiasis. No hydronephrosis. Unremarkable bladder.   Stomach/Bowel: Stomach and appendix are within normal limits. Normal caliber large and small bowel. No bowel wall thickening.   Vascular/Lymphatic: IVC filter. Aortic atherosclerotic calcification. Borderline enlarged celiac node on series 301/image 26 measuring 10 mm, unchanged from prior.   Reproductive: Penile prosthesis.  Unremarkable prostate.   Other: Perinephric stranding tracking inferiorly in the anterior pararenal space is similar to prior. No free intraperitoneal air. No abscess.   Musculoskeletal: IM rod and screw fixation right femur. No acute fracture. Diffuse osseous heterogeneity similar to prior.   IMPRESSION: 1. No substantial change in bilateral renal masses since 01/04/2023. 2. Unchanged 3.6 cm hypervascular mass in the anterior segment of the right hepatic lobe. 3. Bilateral nonobstructing nephrolithiasis. 4. Aortic Atherosclerosis (ICD10-I70.0).  LABS:      Latest Ref Rng & Units 12/13/2023   11:31 AM 06/14/2023   12:19 PM 01/04/2023   12:00 AM  CBC  WBC 3.4 - 10.8 x10E3/uL 7.9  6.4  6.6      Hemoglobin 13.0 - 17.7 g/dL 84.3  83.9  84.5      Hematocrit 37.5 - 51.0 % 48.2  48.9  45      Platelets 150 - 450 x10E3/uL 189  197  192         This result is from an external source.      Latest Ref Rng & Units 12/13/2023   11:31 AM 06/14/2023   12:19 PM 01/04/2023   12:00 AM  CMP  Glucose 70 - 99 mg/dL 83  74    BUN 8 - 27 mg/dL 20  19  21       Creatinine 0.76 - 1.27 mg/dL 8.85  8.91  1.0      Sodium 134 - 144 mmol/L 143  144  139      Potassium 3.5 - 5.2 mmol/L 4.3  5.1  4.6      Chloride 96 - 106 mmol/L 105  104  104      CO2 20 - 29 mmol/L 23  24  26       Calcium 8.6 - 10.2 mg/dL 9.4  9.6  9.4      Total Protein 6.0 - 8.5 g/dL 6.6  6.8    Total Bilirubin 0.0 - 1.2 mg/dL 0.6  0.7    Alkaline Phos 44 - 121 IU/L 83  87  73      AST 0 - 40 IU/L 17  17   35      ALT 0 - 44 IU/L 13  15  22          This result is from an external source.   PATHOLOGY: A. KIDNEY MASS, RIGHT, NEEDLE CORE BIOPSY (10-01-21): Oncocytic neoplasm. See comment.   Comment:  The biopsy is composed of nested, uniformly round cells with  eosinophilic cytoplasm in fibromyxoid stroma. Immunohistochemistry  shows  positive for pax8, cd117, ck7 (patchy), negative for Gata3.  If this biopsy is representative of the entire lesion. It would be  consistent with an oncocytoma.   A. KIDNEY MASS, LEFT,  NEEDLE CORE BIOPSY (06-02-21):  - Compatible with renal oncocytoma.  - See comment.   COMMENT:   A. Core biopsy sections display cords and nests of oncocytic cells with  abundant eosinophilic cytoplasm, and round, regular nuclei.  Immunohistochemical studies show tumor cells to be positive for CD117  (c-kit) and negative for CK7, S100, CA-IX, and p504S (racemase).  These  findings support the above diagnosis.   His liver biopsy from October 2022 revealed the following:   ASSESSMENT & PLAN:  A 73 y.o. male with a biopsy-proven bilateral renal oncocytomas.  In clinic today, I went over his CT scan images with him, for which he could see that his bilateral renal masses remain stable in size.  His liver lesion, which has been biopsy-proven in the past to be hemangioma, is also stable.  As what has been discussed with the patient previously, an oncocytoma is usually not considered a malignant neoplasm.  It can potentially grow over time, but it rarely, if ever, metastasizes.  Overall, the patient is doing well.  I will see him back in 1 year for repeat clinical assessment.  CT scans will be done a day before his next visit to ensure his bilateral renal masses have not enlarged in size.  The patient understands all the plans discussed today and is in agreement with them.  Kima Malenfant DELENA Kerns, MD

## 2024-01-04 NOTE — Telephone Encounter (Signed)
 Please advice  Copied from CRM [id number]. Topic: Clinical - Order For Equipment >> Jan 04, 2024 12:40 PM Antwanette L wrote: Reason for CRM: Joann from [facility] is calling to place an order for a shoe life. Joann can be contacted at [phone]. Please fax the order to [phone]

## 2024-01-05 ENCOUNTER — Inpatient Hospital Stay: Payer: Medicare HMO | Attending: Oncology | Admitting: Oncology

## 2024-01-05 ENCOUNTER — Other Ambulatory Visit: Payer: Self-pay | Admitting: Oncology

## 2024-01-05 ENCOUNTER — Other Ambulatory Visit: Payer: Self-pay

## 2024-01-05 ENCOUNTER — Telehealth: Payer: Self-pay | Admitting: Oncology

## 2024-01-05 VITALS — BP 118/83 | HR 79 | Temp 98.2°F | Resp 20 | Ht 75.0 in | Wt 316.8 lb

## 2024-01-05 DIAGNOSIS — D3001 Benign neoplasm of right kidney: Secondary | ICD-10-CM | POA: Diagnosis not present

## 2024-01-05 DIAGNOSIS — N2889 Other specified disorders of kidney and ureter: Secondary | ICD-10-CM

## 2024-01-05 DIAGNOSIS — D1803 Hemangioma of intra-abdominal structures: Secondary | ICD-10-CM | POA: Insufficient documentation

## 2024-01-05 DIAGNOSIS — D3002 Benign neoplasm of left kidney: Secondary | ICD-10-CM | POA: Insufficient documentation

## 2024-01-05 NOTE — Telephone Encounter (Signed)
 Patient has been scheduled for follow-up visit per 01/05/24 LOS.  Pt given an appt calendar with date and time.

## 2024-01-13 NOTE — Telephone Encounter (Signed)
 Patient states he uses shoes with a lift in them. Patient states he just received a call from [facility] and they have not heard from office. Patient states they are needing a prescription for the shoes. Patient states they need a prescription for a year at a time. Patient unable to get shoes until they receive prescription.   Patient requesting prescription to be sent over to [facility].

## 2024-01-14 ENCOUNTER — Other Ambulatory Visit: Payer: Self-pay

## 2024-01-14 DIAGNOSIS — Z6841 Body Mass Index (BMI) 40.0 and over, adult: Secondary | ICD-10-CM

## 2024-01-14 DIAGNOSIS — M2042 Other hammer toe(s) (acquired), left foot: Secondary | ICD-10-CM

## 2024-01-14 DIAGNOSIS — M21611 Bunion of right foot: Secondary | ICD-10-CM

## 2024-01-14 DIAGNOSIS — L84 Corns and callosities: Secondary | ICD-10-CM

## 2024-01-14 NOTE — Telephone Encounter (Signed)
 Called [facility] Scha[facility] and faxed order for shoe lifts

## 2024-01-21 ENCOUNTER — Other Ambulatory Visit: Payer: Self-pay

## 2024-01-25 DIAGNOSIS — D2372 Other benign neoplasm of skin of left lower limb, including hip: Secondary | ICD-10-CM | POA: Diagnosis not present

## 2024-01-25 DIAGNOSIS — D2262 Melanocytic nevi of left upper limb, including shoulder: Secondary | ICD-10-CM | POA: Diagnosis not present

## 2024-01-25 DIAGNOSIS — L578 Other skin changes due to chronic exposure to nonionizing radiation: Secondary | ICD-10-CM | POA: Diagnosis not present

## 2024-01-25 DIAGNOSIS — D1801 Hemangioma of skin and subcutaneous tissue: Secondary | ICD-10-CM | POA: Diagnosis not present

## 2024-01-25 DIAGNOSIS — D2261 Melanocytic nevi of right upper limb, including shoulder: Secondary | ICD-10-CM | POA: Diagnosis not present

## 2024-01-25 DIAGNOSIS — D2362 Other benign neoplasm of skin of left upper limb, including shoulder: Secondary | ICD-10-CM | POA: Diagnosis not present

## 2024-01-25 DIAGNOSIS — Z86018 Personal history of other benign neoplasm: Secondary | ICD-10-CM | POA: Diagnosis not present

## 2024-01-25 DIAGNOSIS — L821 Other seborrheic keratosis: Secondary | ICD-10-CM | POA: Diagnosis not present

## 2024-01-25 DIAGNOSIS — D225 Melanocytic nevi of trunk: Secondary | ICD-10-CM | POA: Diagnosis not present

## 2024-02-04 ENCOUNTER — Other Ambulatory Visit: Payer: Self-pay

## 2024-02-22 DIAGNOSIS — H348322 Tributary (branch) retinal vein occlusion, left eye, stable: Secondary | ICD-10-CM | POA: Diagnosis not present

## 2024-02-22 DIAGNOSIS — H35711 Central serous chorioretinopathy, right eye: Secondary | ICD-10-CM | POA: Diagnosis not present

## 2024-02-22 DIAGNOSIS — H35361 Drusen (degenerative) of macula, right eye: Secondary | ICD-10-CM | POA: Diagnosis not present

## 2024-02-22 DIAGNOSIS — H34832 Tributary (branch) retinal vein occlusion, left eye, with macular edema: Secondary | ICD-10-CM | POA: Diagnosis not present

## 2024-02-22 DIAGNOSIS — H40053 Ocular hypertension, bilateral: Secondary | ICD-10-CM | POA: Diagnosis not present

## 2024-03-05 ENCOUNTER — Other Ambulatory Visit: Payer: Self-pay

## 2024-03-17 ENCOUNTER — Ambulatory Visit (INDEPENDENT_AMBULATORY_CARE_PROVIDER_SITE_OTHER)

## 2024-03-17 DIAGNOSIS — Z23 Encounter for immunization: Secondary | ICD-10-CM | POA: Diagnosis not present

## 2024-04-12 DIAGNOSIS — I482 Chronic atrial fibrillation, unspecified: Secondary | ICD-10-CM | POA: Diagnosis not present

## 2024-04-12 DIAGNOSIS — E7800 Pure hypercholesterolemia, unspecified: Secondary | ICD-10-CM | POA: Diagnosis not present

## 2024-04-13 ENCOUNTER — Ambulatory Visit

## 2024-04-14 DIAGNOSIS — K5900 Constipation, unspecified: Secondary | ICD-10-CM | POA: Diagnosis not present

## 2024-04-14 DIAGNOSIS — I700 Atherosclerosis of aorta: Secondary | ICD-10-CM | POA: Diagnosis not present

## 2024-04-14 DIAGNOSIS — I10 Essential (primary) hypertension: Secondary | ICD-10-CM | POA: Diagnosis not present

## 2024-04-14 DIAGNOSIS — D6869 Other thrombophilia: Secondary | ICD-10-CM | POA: Diagnosis not present

## 2024-04-14 DIAGNOSIS — I872 Venous insufficiency (chronic) (peripheral): Secondary | ICD-10-CM | POA: Diagnosis not present

## 2024-04-14 DIAGNOSIS — I4891 Unspecified atrial fibrillation: Secondary | ICD-10-CM | POA: Diagnosis not present

## 2024-04-14 DIAGNOSIS — Z86711 Personal history of pulmonary embolism: Secondary | ICD-10-CM | POA: Diagnosis not present

## 2024-04-14 DIAGNOSIS — H269 Unspecified cataract: Secondary | ICD-10-CM | POA: Diagnosis not present

## 2024-04-14 DIAGNOSIS — Z6838 Body mass index (BMI) 38.0-38.9, adult: Secondary | ICD-10-CM | POA: Diagnosis not present

## 2024-04-14 DIAGNOSIS — Z85850 Personal history of malignant neoplasm of thyroid: Secondary | ICD-10-CM | POA: Diagnosis not present

## 2024-04-14 DIAGNOSIS — Z9989 Dependence on other enabling machines and devices: Secondary | ICD-10-CM | POA: Diagnosis not present

## 2024-04-14 DIAGNOSIS — Z91040 Latex allergy status: Secondary | ICD-10-CM | POA: Diagnosis not present

## 2024-04-14 DIAGNOSIS — Z7901 Long term (current) use of anticoagulants: Secondary | ICD-10-CM | POA: Diagnosis not present

## 2024-04-14 DIAGNOSIS — E785 Hyperlipidemia, unspecified: Secondary | ICD-10-CM | POA: Diagnosis not present

## 2024-04-18 ENCOUNTER — Encounter

## 2024-04-18 ENCOUNTER — Ambulatory Visit

## 2024-04-21 ENCOUNTER — Other Ambulatory Visit: Payer: Self-pay

## 2024-05-02 ENCOUNTER — Other Ambulatory Visit: Payer: Self-pay

## 2024-05-05 ENCOUNTER — Other Ambulatory Visit: Payer: Self-pay | Admitting: Physician Assistant

## 2024-05-25 ENCOUNTER — Other Ambulatory Visit: Payer: Self-pay

## 2024-06-13 ENCOUNTER — Ambulatory Visit

## 2024-06-13 VITALS — BP 126/82 | HR 96 | Temp 97.6°F | Ht 75.0 in | Wt 316.0 lb

## 2024-06-13 DIAGNOSIS — E66812 Obesity, class 2: Secondary | ICD-10-CM

## 2024-06-13 DIAGNOSIS — Z0000 Encounter for general adult medical examination without abnormal findings: Secondary | ICD-10-CM

## 2024-06-13 MED ORDER — RIVAROXABAN 20 MG PO TABS: 20.0000 mg | ORAL_TABLET | Freq: Every day | ORAL | 3 refills | Status: AC

## 2024-06-13 MED ORDER — LISINOPRIL 40 MG PO TABS: 40.0000 mg | ORAL_TABLET | Freq: Every day | ORAL | 3 refills | Status: AC

## 2024-06-13 MED ORDER — DILTIAZEM HCL ER BEADS 180 MG PO CP24: 180.0000 mg | ORAL_CAPSULE | Freq: Every day | ORAL | 3 refills | Status: AC

## 2024-06-13 NOTE — Assessment & Plan Note (Addendum)
 With comorbidity of HTN and hyperlipidemia.  Current weight of 316 pounds, down from 333 pounds last year. Goal weight is 250 pounds. Weight loss is being achieved through portion control and increased exercise. - Encouraged continued weight loss through portion control and increased exercise. - Set small, achievable weight loss goals.

## 2024-06-13 NOTE — Progress Notes (Signed)
 Chief Complaint  Patient presents with   Annual Exam     Subjective:   James Schaefer is a 73 y.o. male who presents for a Medicare Annual Wellness Visit. Has no concerns today.   Visit info / Clinical Intake: Medicare Wellness Visit Type:: Subsequent Annual Wellness Visit Persons participating in visit and providing information:: patient Medicare Wellness Visit Mode:: In-person (required for WTM) Interpreter Needed?: No Pre-visit prep was completed: no AWV questionnaire completed by patient prior to visit?: no Living arrangements:: lives with spouse/significant other Patient's Overall Health Status Rating: excellent Typical amount of pain: none Does pain affect daily life?: no Are you currently prescribed opioids?: no  Dietary Habits and Nutritional Risks How many meals a day?: 2 Eats fruit and vegetables daily?: yes Most meals are obtained by: preparing own meals In the last 2 weeks, have you had any of the following?: none Diabetic:: no  Functional Status Activities of Daily Living (to include ambulation/medication): Independent Ambulation: Independent Medication Administration: Independent Home Management (perform basic housework or laundry): Independent Manage your own finances?: yes Primary transportation is: driving Concerns about vision?: no *vision screening is required for WTM* Concerns about hearing?: no  Fall Screening Falls in the past year?: 0 Number of falls in past year: 0 Was there an injury with Fall?: 0 Fall Risk Category Calculator: 0 Patient Fall Risk Level: Low Fall Risk  Fall Risk Patient at Risk for Falls Due to: No Fall Risks Fall risk Follow up: Falls evaluation completed  Home and Transportation Safety: All rugs have non-skid backing?: yes All stairs or steps have railings?: N/A, no stairs Grab bars in the bathtub or shower?: yes Have non-skid surface in bathtub or shower?: yes Good home lighting?: yes Regular seat belt use?:  yes Hospital stays in the last year:: no  Cognitive Assessment Difficulty concentrating, remembering, or making decisions? : no Will 6CIT or Mini Cog be Completed: yes What year is it?: 0 points What month is it?: 0 points Give patient an address phrase to remember (5 components): [address] [place] About what time is it?: 0 points Count backwards from 20 to 1: 0 points Say the months of the year in reverse: 0 points Repeat the address phrase from earlier: 0 points 6 CIT Score: 0 points  Advance Directives (For Healthcare) Does Patient Have a Medical Advance Directive?: Yes Type of Advance Directive: Living will Copy of Living Will in Chart?: No - copy requested  Reviewed/Updated  Reviewed/Updated: Reviewed All (Medical, Surgical, Family, Medications, Allergies, Care Teams, Patient Goals)  Discussed the use of AI scribe software for clinical note transcription with the patient, who gave verbal consent to proceed.  History of Present Illness   James Schaefer is a 73 year old male who presents for a Medicare wellness visit.  Medication adherence - Consistent with all prescribed medications: hydralazine  25 mg three times daily, lisinopril  40 mg once daily, Xarelto  20 mg daily, pravastatin , and diltiazem  180 mg daily - No missed doses  Bowel habit changes - Bowel movements every two to three days - Occasional use of laxative once a week or every other week - Linzess  previously trialed without benefit  Weight management - Actively working on weight loss - Weight decreased from 333 pounds in July last year to 316 pounds today (home weight 310 pounds) - Long-term goal weight is 250 pounds - Focusing on portion control and increasing exercise  Renal mass surveillance - CT scan of abdomen in June for  follow-up of previously identified kidney mass  Preventive care and lifestyle - Annual eye exams up to date - Dental exam due this month or next - Maintains an  independent lifestyle without falls - No symptoms of depression: 'I don't worry about things. I take it as it comes.' - Attempts to eat healthily       Allergies (verified) Codeine, Pedi-pre tape spray [wound dressing adhesive], and Tape   Current Medications (verified) Outpatient Encounter Medications as of 06/13/2024  Medication Sig   ascorbic acid (VITAMIN C) 500 MG tablet Take 500 mg by mouth daily.   hydrALAZINE  (APRESOLINE ) 25 MG tablet TAKE 1 TABLET BY MOUTH THREE TIMES A DAY   Multiple Vitamin (MULTIVITAMIN) capsule Take 1 capsule by mouth daily.   pravastatin  (PRAVACHOL ) 40 MG tablet Take 1 tablet (40 mg total) by mouth every evening.   vitamin B-12 (CYANOCOBALAMIN ) 1000 MCG tablet Take 1,000 mcg by mouth every evening.   [DISCONTINUED] linaclotide  (LINZESS ) 145 MCG CAPS capsule Take 1 capsule (145 mcg total) by mouth daily before breakfast.   [DISCONTINUED] lisinopril  (ZESTRIL ) 40 MG tablet TAKE 1 TABLET BY MOUTH EVERY DAY   [DISCONTINUED] rivaroxaban  (XARELTO ) 20 MG TABS tablet Take 1 tablet (20 mg total) by mouth daily with supper.   [DISCONTINUED] TIADYLT  ER 180 MG 24 hr capsule TAKE 1 CAPSULE BY MOUTH EVERY DAY   diltiazem  (TIADYLT  ER) 180 MG 24 hr capsule Take 1 capsule (180 mg total) by mouth daily.   lisinopril  (ZESTRIL ) 40 MG tablet Take 1 tablet (40 mg total) by mouth daily.   rivaroxaban  (XARELTO ) 20 MG TABS tablet Take 1 tablet (20 mg total) by mouth daily with supper.   No facility-administered encounter medications on file as of 06/13/2024.    History: Past Medical History:  Diagnosis Date   Hypercholesteremia    Hypertension    Past Surgical History:  Procedure Laterality Date   HERNIA REPAIR     INTRAMEDULLARY (IM) NAIL INTERTROCHANTERIC Right 09/04/2013   Procedure: INTRAMEDULLARY (IM) NAIL INTERTROCHANTRIC;  Surgeon: LELON JONETTA Shari Mickey., MD;  Location: [facility] OR;  Service: Orthopedics;  Laterality: Right;   knee replacements     penile pump     Family History   Problem Relation Age of Onset   Dementia Mother    CAD Father    Kidney failure Father    Asthma Sister    Pancreatic cancer Maternal Uncle    Social History   Occupational History   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  Tobacco Use   Smoking status: Former    Current packs/day: 0.00    Types: Cigarettes    Quit date: 09/04/1978    Years since quitting: 45.8   Smokeless tobacco: Former    Quit date: 09/04/1978  Vaping Use   Vaping status: Never Used  Substance and Sexual Activity   Alcohol use: Yes    Comment: occassional   Drug use: No   Sexual activity: Not Currently   Tobacco Counseling Counseling given: Not Answered  SDOH Screenings   Food Insecurity: No Food Insecurity (06/13/2024)  Housing: Unknown (06/13/2024)  Transportation Needs: No Transportation Needs (06/13/2024)  Utilities: Not At Risk (06/13/2024)  Alcohol Screen: Low Risk (05/20/2023)  Depression (PHQ2-9): Low Risk (06/13/2024)  Financial Resource Strain: Low Risk (05/20/2023)  Physical Activity: Insufficiently Active (06/13/2024)  Social Connections: Moderately Integrated (06/13/2024)  Stress: No Stress Concern Present (06/13/2024)  Tobacco Use: Medium Risk (06/13/2024)  Health Literacy: Adequate Health Literacy (06/13/2024)   See flowsheets for full screening  details  Depression Screen PHQ 2 & 9 Depression Scale- Over the past 2 weeks, how often have you been bothered by any of the following problems? Little interest or pleasure in doing things: 0 Feeling down, depressed, or hopeless (PHQ Adolescent also includes...irritable): 0 PHQ-2 Total Score: 0 Trouble falling or staying asleep, or sleeping too much: 0 Feeling tired or having little energy: 0 Poor appetite or overeating (PHQ Adolescent also includes...weight loss): 0 Feeling bad about yourself - or that you are a failure or have let yourself or your family down: 0 Trouble concentrating on things, such as reading the newspaper or watching  television (PHQ Adolescent also includes...like school work): 0 Moving or speaking so slowly that other people could have noticed. Or the opposite - being so fidgety or restless that you have been moving around a lot more than usual: 0 Thoughts that you would be better off dead, or of hurting yourself in some way: 0 PHQ-9 Total Score: 0 If you checked off any problems, how difficult have these problems made it for you to do your work, take care of things at home, or get along with other people?: Not difficult at all  Depression Treatment Depression Interventions/Treatment : [id number] Score <4 Follow-up Not Indicated     Goals Addressed               This Visit's Progress     Patient Stated (pt-stated)        Patient states he would like to lose weight and stay healthy.              Objective:    Today's Vitals   06/13/24 1008  BP: 126/82  Pulse: 96  Temp: 97.6 F (36.4 C)  SpO2: 98%  Weight: (!) 316 lb (143.3 kg)  Height: 6' 3 (1.905 m)   Body mass index is 39.5 kg/m.  Hearing/Vision screen No results found. Immunizations and Health Maintenance Health Maintenance  Topic Date Due   Hepatitis C Screening  Never done   COVID-19 Vaccine (3 - Moderna risk series) 10/01/2019   Medicare Annual Wellness (AWV)  06/13/2025   Colonoscopy  10/24/2025   DTaP/Tdap/Td (2 - Td or Tdap) 02/02/2027   Pneumococcal Vaccine: 50+ Years  Completed   Influenza Vaccine  Completed   Zoster Vaccines- Shingrix  Completed   Meningococcal B Vaccine  Aged Out        Assessment/Plan:  This is a routine wellness examination for Pranav.  Patient Care Team: Kylie Simmonds, MD as PCP - General (Family Medicine)     Encounter for Medicare annual wellness exam Assessment & Plan: Adult Wellness Visit Annual wellness visit conducted. Blood pressure is well-controlled with current medications. No falls reported. Cognitive function is intact. Advanced directives and living will are in  place. No signs of depression. Independent in daily activities. Regular eye and dental exams are maintained. Weight has decreased from 333 pounds in July last year to 316 pounds currently, with a goal of 250 pounds. - Continue current medications: hydralazine , lisinopril , Xarelto , pravastatin , and diltiazem . - Encouraged regular exercise and portion control to achieve weight loss goals. - Scheduled follow-up appointment in mid-March for blood work.   Class 2 severe obesity due to excess calories with serious comorbidity and body mass index (BMI) of 39.0 to 39.9 in adult Assessment & Plan: With comorbidity of HTN and hyperlipidemia.  Current weight of 316 pounds, down from 333 pounds last year. Goal weight is 250 pounds. Weight loss is being  achieved through portion control and increased exercise. - Encouraged continued weight loss through portion control and increased exercise. - Set small, achievable weight loss goals.   Other orders -     Rivaroxaban ; Take 1 tablet (20 mg total) by mouth daily with supper.  Dispense: 90 tablet; Refill: 3 -     Lisinopril ; Take 1 tablet (40 mg total) by mouth daily.  Dispense: 90 tablet; Refill: 3 -     dilTIAZem  HCl ER Beads; Take 1 capsule (180 mg total) by mouth daily.  Dispense: 90 capsule; Refill: 3    Hypertension Well-controlled with current medication regimen. - Continue hydralazine  25 mg three times daily and lisinopril  40 mg once daily.  Hyperlipidemia Managed with pravastatin . - Continue pravastatin  as prescribed.   Chronic kidney mass Noted on CT scan in June with no changes since 2024. Decision made to monitor with less frequent imaging to avoid unnecessary radiation exposure. - Continue monitoring kidney mass with CT scans every other year.  Constipation Chronic constipation with bowel movements every two to three days. Linzess  was ineffective. Advised to increase physical activity, water  intake, and dietary fiber to manage symptoms  naturally. - Increase physical activity, water  intake, and dietary fiber. - Avoid Linzess  as it was ineffective.        I have personally reviewed and noted the following in the patients chart:   Medical and social history Use of alcohol, tobacco or illicit drugs  Current medications and supplements including opioid prescriptions. Functional ability and status Nutritional status Physical activity Advanced directives List of other physicians Hospitalizations, surgeries, and ER visits in previous 12 months Vitals Screenings to include cognitive, depression, and falls Referrals and appointments  No orders of the defined types were placed in this encounter.  In addition, I have reviewed and discussed with patient certain preventive protocols, quality metrics, and best practice recommendations. A written personalized care plan for preventive services as well as general preventive health recommendations were provided to patient.   Yancy Hascall, MD   06/13/2024   Return in 3 months (on 09/11/2024) for chronic disease follow up.  After Visit Summary: (In Person-Printed) AVS printed and given to the patient  Nurse Notes:

## 2024-06-13 NOTE — Assessment & Plan Note (Signed)
 Adult Wellness Visit Annual wellness visit conducted. Blood pressure is well-controlled with current medications. No falls reported. Cognitive function is intact. Advanced directives and living will are in place. No signs of depression. Independent in daily activities. Regular eye and dental exams are maintained. Weight has decreased from 333 pounds in July last year to 316 pounds currently, with a goal of 250 pounds. - Continue current medications: hydralazine , lisinopril , Xarelto , pravastatin , and diltiazem . - Encouraged regular exercise and portion control to achieve weight loss goals. - Scheduled follow-up appointment in mid-March for blood work.

## 2024-06-13 NOTE — Patient Instructions (Signed)
 James Schaefer,  Thank you for taking the time for your Medicare Wellness Visit. I appreciate your continued commitment to your health goals. Please review the care plan we discussed, and feel free to reach out if I can assist you further.  Please note that Annual Wellness Visits do not include a physical exam. Some assessments may be limited, especially if the visit was conducted virtually. If needed, we may recommend an in-person follow-up with your provider.  Ongoing Care Seeing your primary care provider every 3 to 6 months helps us  monitor your health and provide consistent, personalized care.   Referrals If a referral was made during today's visit and you haven't received any updates within two weeks, please contact the referred provider directly to check on the status.  Recommended Screenings:  Health Maintenance  Topic Date Due   Hepatitis C Screening  Never done   COVID-19 Vaccine (3 - Moderna risk series) 10/01/2019   Medicare Annual Wellness Visit  06/13/2025   Colon Cancer Screening  10/24/2025   DTaP/Tdap/Td vaccine (2 - Td or Tdap) 02/02/2027   Pneumococcal Vaccine for age over 44  Completed   Flu Shot  Completed   Zoster (Shingles) Vaccine  Completed   Meningitis B Vaccine  Aged Out       06/13/2024   10:12 AM  Advanced Directives  Does Patient Have a Medical Advance Directive? Yes  Type of Advance Directive Living will    Vision: Annual vision screenings are recommended for early detection of glaucoma, cataracts, and diabetic retinopathy. These exams can also reveal signs of chronic conditions such as diabetes and high blood pressure.  Dental: Annual dental screenings help detect early signs of oral cancer, gum disease, and other conditions linked to overall health, including heart disease and diabetes.  Please see the attached documents for additional preventive care recommendations.

## 2024-09-12 ENCOUNTER — Ambulatory Visit

## 2025-01-04 ENCOUNTER — Ambulatory Visit: Admitting: Oncology
